# Patient Record
Sex: Female | Born: 1965
Health system: Southern US, Community
[De-identification: ages and names within clinical notes are randomized; demographics above are authoritative.]

## PROBLEM LIST (undated history)

## (undated) DIAGNOSIS — Z860100 Personal history of colon polyps, unspecified: Secondary | ICD-10-CM

## (undated) DIAGNOSIS — T7840XA Allergy, unspecified, initial encounter: Secondary | ICD-10-CM

## (undated) DIAGNOSIS — Z9109 Other allergy status, other than to drugs and biological substances: Secondary | ICD-10-CM

## (undated) DIAGNOSIS — L509 Urticaria, unspecified: Secondary | ICD-10-CM

## (undated) DIAGNOSIS — K219 Gastro-esophageal reflux disease without esophagitis: Secondary | ICD-10-CM

## (undated) DIAGNOSIS — R931 Abnormal findings on diagnostic imaging of heart and coronary circulation: Secondary | ICD-10-CM

## (undated) DIAGNOSIS — Z87898 Personal history of other specified conditions: Secondary | ICD-10-CM

## (undated) DIAGNOSIS — R51 Headache: Secondary | ICD-10-CM

## (undated) DIAGNOSIS — F419 Anxiety disorder, unspecified: Secondary | ICD-10-CM

## (undated) DIAGNOSIS — K648 Other hemorrhoids: Secondary | ICD-10-CM

## (undated) DIAGNOSIS — N62 Hypertrophy of breast: Secondary | ICD-10-CM

## (undated) DIAGNOSIS — E785 Hyperlipidemia, unspecified: Secondary | ICD-10-CM

## (undated) DIAGNOSIS — Z85828 Personal history of other malignant neoplasm of skin: Secondary | ICD-10-CM

## (undated) HISTORY — DX: Personal history of other malignant neoplasm of skin: Z85.828

## (undated) HISTORY — DX: Anxiety disorder, unspecified: F41.9

## (undated) HISTORY — DX: Other hemorrhoids: K64.8

## (undated) HISTORY — PX: POLYPECTOMY: SHX149

## (undated) HISTORY — PX: DESTRUCTION OF LESION ANAL: SUR415

## (undated) HISTORY — DX: Personal history of other specified conditions: Z87.898

## (undated) HISTORY — DX: Urticaria, unspecified: L50.9

## (undated) HISTORY — DX: Other allergy status, other than to drugs and biological substances: Z91.09

## (undated) HISTORY — DX: Abnormal findings on diagnostic imaging of heart and coronary circulation: R93.1

## (undated) HISTORY — DX: Hypertrophy of breast: N62

## (undated) HISTORY — DX: Personal history of colon polyps, unspecified: Z86.0100

## (undated) HISTORY — PX: MOHS SURGERY: SUR867

## (undated) HISTORY — DX: Gastro-esophageal reflux disease without esophagitis: K21.9

## (undated) HISTORY — DX: Headache: R51

## (undated) HISTORY — DX: Allergy, unspecified, initial encounter: T78.40XA

## (undated) HISTORY — DX: Hyperlipidemia, unspecified: E78.5

## (undated) HISTORY — PX: WISDOM TOOTH EXTRACTION: SHX21

## (undated) HISTORY — PX: COLONOSCOPY: SHX174

## (undated) HISTORY — DX: Personal history of colonic polyps: Z86.010

## (undated) HISTORY — PX: DILATION AND CURETTAGE OF UTERUS: SHX78

---

## 2006-11-27 LAB — HM COLONOSCOPY: HM Colonoscopy: NORMAL

## 2008-10-30 ENCOUNTER — Emergency Department (HOSPITAL_COMMUNITY): Admission: EM | Admit: 2008-10-30 | Discharge: 2008-10-30 | Payer: Self-pay | Admitting: Emergency Medicine

## 2009-02-22 LAB — CONVERTED CEMR LAB: Pap Smear: NORMAL

## 2009-02-22 LAB — HM MAMMOGRAPHY: HM Mammogram: NORMAL

## 2009-03-09 ENCOUNTER — Ambulatory Visit: Payer: Self-pay | Admitting: Internal Medicine

## 2009-03-09 DIAGNOSIS — Z85828 Personal history of other malignant neoplasm of skin: Secondary | ICD-10-CM | POA: Insufficient documentation

## 2009-03-09 DIAGNOSIS — F410 Panic disorder [episodic paroxysmal anxiety] without agoraphobia: Secondary | ICD-10-CM | POA: Insufficient documentation

## 2009-03-09 DIAGNOSIS — T50995A Adverse effect of other drugs, medicaments and biological substances, initial encounter: Secondary | ICD-10-CM | POA: Insufficient documentation

## 2009-03-09 DIAGNOSIS — R519 Headache, unspecified: Secondary | ICD-10-CM | POA: Insufficient documentation

## 2009-03-09 DIAGNOSIS — R32 Unspecified urinary incontinence: Secondary | ICD-10-CM | POA: Insufficient documentation

## 2009-03-09 DIAGNOSIS — R51 Headache: Secondary | ICD-10-CM | POA: Insufficient documentation

## 2009-03-09 DIAGNOSIS — E785 Hyperlipidemia, unspecified: Secondary | ICD-10-CM | POA: Insufficient documentation

## 2009-03-09 DIAGNOSIS — D126 Benign neoplasm of colon, unspecified: Secondary | ICD-10-CM | POA: Insufficient documentation

## 2009-03-09 DIAGNOSIS — A630 Anogenital (venereal) warts: Secondary | ICD-10-CM | POA: Insufficient documentation

## 2009-03-09 DIAGNOSIS — Z86010 Personal history of colonic polyps: Secondary | ICD-10-CM | POA: Insufficient documentation

## 2009-03-22 LAB — CONVERTED CEMR LAB
ALT: 13 units/L (ref 0–35)
AST: 19 units/L (ref 0–37)
Albumin: 3.8 g/dL (ref 3.5–5.2)
Alkaline Phosphatase: 55 units/L (ref 39–117)
BUN: 10 mg/dL (ref 6–23)
Basophils Absolute: 0 10*3/uL (ref 0.0–0.1)
Basophils Relative: 0 % (ref 0.0–3.0)
Bilirubin, Direct: 0.1 mg/dL (ref 0.0–0.3)
CO2: 29 meq/L (ref 19–32)
Calcium: 8.7 mg/dL (ref 8.4–10.5)
Chloride: 106 meq/L (ref 96–112)
Cholesterol: 207 mg/dL — ABNORMAL HIGH (ref 0–200)
Creatinine, Ser: 0.8 mg/dL (ref 0.4–1.2)
Direct LDL: 131.4 mg/dL
Eosinophils Absolute: 0.1 10*3/uL (ref 0.0–0.7)
Eosinophils Relative: 1.2 % (ref 0.0–5.0)
Ferritin: 20.8 ng/mL (ref 10.0–291.0)
Free T4: 0.7 ng/dL (ref 0.6–1.6)
GFR calc non Af Amer: 84.29 mL/min (ref >60)
Glucose, Bld: 63 mg/dL — ABNORMAL LOW (ref 70–99)
HCT: 34.4 % — ABNORMAL LOW (ref 36.0–46.0)
HDL: 56.3 mg/dL (ref >39.00)
Hemoglobin: 11.8 g/dL — ABNORMAL LOW (ref 12.0–15.0)
Lymphocytes Relative: 44.4 % (ref 12.0–46.0)
Lymphs Abs: 2.5 10*3/uL (ref 0.7–4.0)
MCHC: 34.2 g/dL (ref 30.0–36.0)
MCV: 90.6 fL (ref 78.0–100.0)
Monocytes Absolute: 0.3 10*3/uL (ref 0.1–1.0)
Monocytes Relative: 4.8 % (ref 3.0–12.0)
Neutro Abs: 2.8 10*3/uL (ref 1.4–7.7)
Neutrophils Relative %: 49.6 % (ref 43.0–77.0)
Platelets: 315 10*3/uL (ref 150.0–400.0)
Potassium: 3.8 meq/L (ref 3.5–5.1)
RBC: 3.79 M/uL — ABNORMAL LOW (ref 3.87–5.11)
RDW: 13 % (ref 11.5–14.6)
Sodium: 141 meq/L (ref 135–145)
TSH: 0.87 microintl units/mL (ref 0.35–5.50)
Total Bilirubin: 0.7 mg/dL (ref 0.3–1.2)
Total CHOL/HDL Ratio: 4
Total Protein: 6.4 g/dL (ref 6.0–8.3)
Triglycerides: 54 mg/dL (ref 0.0–149.0)
VLDL: 10.8 mg/dL (ref 0.0–40.0)
WBC: 5.7 10*3/uL (ref 4.5–10.5)

## 2009-04-06 ENCOUNTER — Ambulatory Visit: Payer: Self-pay | Admitting: Family Medicine

## 2009-04-06 DIAGNOSIS — A088 Other specified intestinal infections: Secondary | ICD-10-CM | POA: Insufficient documentation

## 2009-04-09 ENCOUNTER — Ambulatory Visit: Payer: Self-pay | Admitting: Internal Medicine

## 2009-04-09 DIAGNOSIS — R5383 Other fatigue: Secondary | ICD-10-CM

## 2009-04-09 DIAGNOSIS — R5381 Other malaise: Secondary | ICD-10-CM | POA: Insufficient documentation

## 2009-09-02 ENCOUNTER — Ambulatory Visit: Payer: Self-pay | Admitting: Internal Medicine

## 2010-04-11 ENCOUNTER — Ambulatory Visit: Payer: Self-pay | Admitting: Internal Medicine

## 2010-04-11 DIAGNOSIS — J019 Acute sinusitis, unspecified: Secondary | ICD-10-CM | POA: Insufficient documentation

## 2010-08-05 ENCOUNTER — Ambulatory Visit: Payer: Self-pay | Admitting: Family Medicine

## 2010-08-05 DIAGNOSIS — R599 Enlarged lymph nodes, unspecified: Secondary | ICD-10-CM | POA: Insufficient documentation

## 2010-08-05 DIAGNOSIS — R0982 Postnasal drip: Secondary | ICD-10-CM | POA: Insufficient documentation

## 2010-09-01 ENCOUNTER — Ambulatory Visit: Payer: Self-pay | Admitting: Internal Medicine

## 2010-12-27 NOTE — Assessment & Plan Note (Signed)
Summary: diarrhea   Vital Signs:  Patient profile:   45 year old female Menstrual status:  regular Weight:      142 pounds Temp:     98.2 degrees F oral BP sitting:   80 / 60  (left arm) Cuff size:   regular  Vitals Entered By: Sid Falcon LPN (Apr 06, 2009 10:01 AM)  Serial Vital Signs/Assessments:  Time      Position  BP       Pulse  Resp  Temp     By                     84/60                          Evelena Peat MD  CC: Past week pt experienced stomach pains, cramping, diarrhea. Is Patient Diabetic? No   History of Present Illness: Patient is a work in with onset of illness last week. She had onset Thursday of some fairly severe GERD symptoms after eating steak. By the next day she developed some nonbloody diarrhea but no nausea, vomiting, or fever. Her husband did not have similar symptoms. She had fairly severe diarrhea Friday and Saturday with Saturday having watery nonbloody stools approximately every half hour. Still no fever. By Sunday she tried drinking some chicken broth but had some diffuse fairly severe abdominal cramps. Yesterday, she had solid bowel movements the by this morning had 3 episodes of loose stools. She did eat some peanut butter last night which he thinks may have been a factor in that. She had low-grade fever of 100.1 Saturday but none since then. No recent travels. No recent antibiotics. No change of medication. She has not tried any Imodium.  no orthostatic symptoms.  Allergies: 1)  ! Codeine  Past History:  Past Medical History:    Headache  Migraines      Hyperlipidemia    Urinary incontinence related to  childbirth  urge and stress  [name] helps     Skin cancer, hx of basal cell ca     Colonic polyps, hx of  found during eval for rectal bleeding.  ?     Hx of syncope with low bp  orthostatic  onset Hs      G4 P2      (03/09/2009)  Review of Systems      See HPI  Physical Exam  General:  patient is alert and healthy in appearance  Mouth:  Oral mucosa and oropharynx without lesions or exudates.  Teeth in good repair. Heart:  Normal rate and regular rhythm. S1 and S2 normal without gallop, murmur, click, rub or other extra sounds. Abdomen:  nondistended. Normal bowel sounds. Soft and nontender. No organomegaly.   Impression & Recommendations:  Problem # 1:  GASTROENTERITIS, VIRAL (ICD-008.8) She appears well-hydrated. Suspect this is resolving. She'll try over-the-counter Imodium and also continue electrolyte replacement with things like Gatorade.  Touch base if diarrhea not resolved in one week.  Complete Medication List: 1)  Toviaz 4 Mg Xr24h-tab (Fesoterodine fumarate) .Marland Kitchen.. 1 by mouth once daily 2)  Lexapro 10 Mg Tabs (Escitalopram oxalate) .Marland Kitchen.. 1 by mouth once daily 3)  Alprazolam 0.25 Mg Tabs (Alprazolam) .... As needed

## 2010-12-27 NOTE — Assessment & Plan Note (Signed)
Summary: ?sinus inf/chest congestion/cjr   Vital Signs:  Patient profile:   45 year old female Menstrual status:  regular Height:      66.25 inches Weight:      145.8 pounds BMI:     23.44 Temp:     98.1 degrees F oral Pulse rate:   91 / minute BP sitting:   90 / 60  (right arm)  Vitals Entered By: Kathrynn Speed CMA (Apr 11, 2010 2:27 PM) CC: Sinus/ headache/ tightness in chest & coughin 1 week   History of Present Illness: Tamara Watkins comes  in for 3 weeks of signs  onset with cough and uri.   Onset like a head   cold  and never  went away. hearin ok   HA in frontal area and face and teeth  hurts.  Some swollen glands and then coughing and drainage .  tried  aline irrigations No fever . No  hx of recurrent sinus problem or allergy. NO sig ets.   Airflight weekly . LAst antibiotic  doxy .         Preventive Screening-Counseling & Management  Alcohol-Tobacco     Alcohol drinks/day: 2     Alcohol type: wine     Smoking Status: never  Caffeine-Diet-Exercise     Caffeine use/day: 1     Does Patient Exercise: yes     Type of exercise: jogging or swim     Exercise (avg: min/session): 30-60     Times/week: 3  Current Medications (verified): 1)  Toviaz 4 Mg Xr24h-Tab (Fesoterodine Fumarate) .Marland Kitchen.. 1 By Mouth Once Daily 2)  Lexapro 10 Mg Tabs (Escitalopram Oxalate) .Marland Kitchen.. 1 By Mouth Once Daily 3)  Alprazolam 0.25 Mg Tabs (Alprazolam) .... As Needed  Allergies (verified): 1)  ! Codeine  Past History:  Past medical, surgical, family and social histories (including risk factors) reviewed for relevance to current acute and chronic problems.  Past Medical History: Reviewed history from 03/09/2009 and no changes required. Headache  Migraines   Hyperlipidemia Urinary incontinence related to  childbirth  urge and stress  [name] helps  Skin cancer, hx of basal cell ca  Colonic polyps, hx of  found during eval for rectal bleeding.  ?  Hx of syncope with low bp  orthostatic   onset Hs   G4 P2   Past Surgical History: Reviewed history from 03/09/2009 and no changes required. Caesarean section D and E for miscarrage P4 G2  Family History: Reviewed history from 03/09/2009 and no changes required. Father: Colon Polyp, depression, HBP, tranverse mylitis Mother High Cholesterol Siblings:  Brother-Depression, HBP, Sister-Depression and anxiety  children allergies .   Social History: Reviewed history from 03/09/2009 and no changes required. Occupation: Estate agent.. Masters  level education. Married  Never Smoked Alcohol use-yes Drug use-no Regular exercise-yes hhof 4   no pets    recently moved from [place]  Caffeine use/day:  1  Review of Systems  The patient denies anorexia, fever, chest pain, and abdominal pain.         faitgue and achey .  no sig GU or GI illness .    NO fever   Physical Exam  General:  Well-developed,well-nourished,in no acute distress; alert,appropriate and cooperative throughout examination Head:  normocephalic and atraumatic.   Eyes:  vision grossly intact, pupils equal, and pupils round.   Ears:  R ear normal and L ear normal.   Nose:  congested and tender maxillay frontal area  Mouth:  drainage tracts no  edema no lesions Neck:  shoddy  nodes  Lungs:  Normal respiratory effort, chest expands symmetrically. Lungs are clear to auscultation, no crackles or wheezes. Heart:  normal rate and regular rhythm.   Pulses:  nl cap  refill  Neurologic:  non focal  grossly Skin:  turgor normal, color normal, no ecchymoses, and no petechiae.   Cervical Nodes:  shoddy   Impression & Recommendations:  Problem # 1:  SINUSITIS - ACUTE-NOS (ICD-461.9)  prolonged uri 3 weeks with sinus signs   disc use of decon pre airflights .    Her updated medication list for this problem includes:    Amoxicillin-pot Clavulanate 875-125 Mg Tabs (Amoxicillin-pot clavulanate) .Marland Kitchen... 1 by mouth two times a day for sinusitis  Instructed on  treatment. Call if symptoms persist or worsen.   Complete Medication List: 1)  Toviaz 4 Mg Xr24h-tab (Fesoterodine fumarate) .Marland Kitchen.. 1 by mouth once daily 2)  Lexapro 10 Mg Tabs (Escitalopram oxalate) .Marland Kitchen.. 1 by mouth once daily 3)  Alprazolam 0.25 Mg Tabs (Alprazolam) .... As needed 4)  Amoxicillin-pot Clavulanate 875-125 Mg Tabs (Amoxicillin-pot clavulanate) .Marland Kitchen.. 1 by mouth two times a day for sinusitis  Patient Instructions: 1)  take antibiotic   saline washes and decongestant before flight. 2)  expect improvement in 3-5 days  3)  call if  needed .  Prescriptions: AMOXICILLIN-POT CLAVULANATE 875-125 MG TABS (AMOXICILLIN-POT CLAVULANATE) 1 by mouth two times a day for sinusitis  #20 x 0   Entered and Authorized by:   Madelin Headings MD   Signed by:   Madelin Headings MD on 04/11/2010   Method used:   Electronically to        [facility]. [phone]* (retail)       [phone]       [place], [place]  [address]       Ph: [phone]       Fax: [phone]   RxID:   [id number]

## 2010-12-27 NOTE — Assessment & Plan Note (Signed)
Summary: flu shot/cjr  Nurse Visit   Allergies: 1)  ! Codeine  Orders Added: 1)  Admin 1st Vaccine [90471] 2)  Flu Vaccine 51yrs + [34742] Flu Vaccine Consent Questions     Do you have a history of severe allergic reactions to this vaccine? no    Any prior history of allergic reactions to egg and/or gelatin? no    Do you have a sensitivity to the preservative Thimersol? no    Do you have a past history of Guillan-Barre Syndrome? no    Do you currently have an acute febrile illness? no    Have you ever had a severe reaction to latex? no    Vaccine information given and explained to patient? yes    Are you currently pregnant? no    Lot Number:[id number]   Exp Date:05/26/2010   Site Given  Left Deltoid IMes-CCC]  .lbflu

## 2010-12-27 NOTE — Assessment & Plan Note (Signed)
Summary: flu shot/njr  Nurse Visit   Allergies: 1)  ! Codeine  Orders Added: 1)  Admin 1st Vaccine [90471] 2)  Flu Vaccine 37yrs + [16109]  Flu Vaccine Consent Questions     Do you have a history of severe allergic reactions to this vaccine? no    Any prior history of allergic reactions to egg and/or gelatin? no    Do you have a sensitivity to the preservative Thimersol? no    Do you have a past history of Guillan-Barre Syndrome? no    Do you currently have an acute febrile illness? no    Have you ever had a severe reaction to latex? no    Vaccine information given and explained to patient? yes    Are you currently pregnant? no    Lot Number:[id number]   Exp Date:05/27/2011   Site Given  Left Deltoid IM Romualdo Bolk, CMA (AAMA)  September 01, 2010 10:14 AM

## 2010-12-27 NOTE — Assessment & Plan Note (Signed)
Summary: Ear pain/sore throat/cb resched. per debbie/cb   Vital Signs:  Patient profile:   45 year old female Menstrual status:  regular LMP:     07/04/2010 Weight:      152 pounds Temp:     98.9 degrees F oral Pulse rate:   88 / minute BP sitting:   100 / 60  (right arm) Cuff size:   regular  Vitals Entered By: Romualdo Bolk, CMA (AAMA) (August 05, 2010 4:32 PM) CC: Bilateral ear pain that is worse in rt ear than left. Scratchy voice on and off x 7 weeks worse in afternoons. Sinus ha's for 6 weeks. LMP (date): 07/04/2010 LMP - Character: normal Menarche (age onset years): 14   Menses interval (days): 28-30 Menstrual flow (days): 4 Enter LMP: 07/04/2010 Last PAP Result normal   History of Present Illness: Tamara Watkins comes in today  for  sda for above problem.   harse and thooat drainage for weeks   Has tried  sinus  med with help for ha not rest.  chronic drainage    and recently ear pain. with HA s daily unsure cause.       travels in air    2 x per week and   out of town Mon to [place]. Symptoms are worse in eve and late pm.   no fever or sweats.  the right  gland neck tender.  to touch not to swallow .  No hx of mono . LAst uri sinusitis got better on antiboitic in May.  Preventive Screening-Counseling & Management  Alcohol-Tobacco     Alcohol drinks/day: 2     Alcohol type: wine     Smoking Status: never  Caffeine-Diet-Exercise     Caffeine use/day: 1     Does Patient Exercise: yes     Type of exercise: jogging or swim     Exercise (avg: min/session): 30-60     Times/week: 3  Current Medications (verified): 1)  Toviaz 8 Mg Xr24h-Tab (Fesoterodine Fumarate) .Marland Kitchen.. 1 By Mouth Once Daily 2)  Lexapro 10 Mg Tabs (Escitalopram Oxalate) .Marland Kitchen.. 1 By Mouth Once Daily 3)  Alprazolam 0.25 Mg Tabs (Alprazolam) .... As Needed 4)  Amoxicillin-Pot Clavulanate 875-125 Mg Tabs (Amoxicillin-Pot Clavulanate) .Marland Kitchen.. 1 By Mouth Two Times A Day For Sinusitis  Allergies  (verified): 1)  ! Codeine  Past History:  Past medical, surgical, family and social histories (including risk factors) reviewed, and no changes noted (except as noted below).  Past Medical History: Reviewed history from 03/09/2009 and no changes required. Headache  Migraines   Hyperlipidemia Urinary incontinence related to  childbirth  urge and stress  [name] helps  Skin cancer, hx of basal cell ca  Colonic polyps, hx of  found during eval for rectal bleeding.  ?  Hx of syncope with low bp  orthostatic  onset Hs   G4 P2   Past Surgical History: Reviewed history from 03/09/2009 and no changes required. Caesarean section D and E for miscarrage P4 G2  Past History:  Care Management: Gynecology: Dr. Malva Limes Gastroenterology: In [facility]  Dermatology: Dr. Deloria Lair in [place]- Pt to find a md in [place]  Family History: Reviewed history from 03/09/2009 and no changes required. Father: Colon Polyp, depression, HBP, tranverse mylitis Mother High Cholesterol Siblings:  Brother-Depression, HBP, Sister-Depression and anxiety  children allergies .   Social History: Reviewed history from 03/09/2009 and no changes required. 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         lexapro helps with anxiety and mood  rarely takes xanax   Physical Exam  General:  mildly hoarse in nad looks well well-developed, well-nourished, and appropriate dress.   Head:  normocephalic and atraumatic.   Eyes:    PERRL, EOMs full, conjunctiva clear  Ears:  R ear normal, L ear normal, and no external deformities.   Nose:   no external deformity.  congestion  no facial tenderness says hurts behind eyes  Mouth:  1+ red no exudate or edema  .   no lesions Neck:  midly tender right ac node  1.5 cm ovoid    Lungs:  Normal respiratory effort, chest expands symmetrically. Lungs are clear to auscultation, no crackles or wheezes. Heart:  normal rate, regular rhythm, and no murmur.   Abdomen:  Bowel sounds positive,abdomen soft and non-tender without masses, organomegaly or hernias noted. Msk:  no joint swelling and no joint warmth.   Pulses:  nl cap refill  Extremities:  nl cap refill  Neurologic:  non focal  Skin:  turgor normal, color normal, no ecchymoses, and no petechiae.   Cervical Nodes:  no posterior cervical adenopathy.   right ac see  neck exam  Axillary Nodes:  No palpable lymphadenopathy Inguinal Nodes:  No significant adenopathy Psych:  Oriented X3, good eye contact, not anxious appearing, and not depressed appearing.     Impression & Recommendations:  Problem # 1:  CERVICAL LYMPHADENOPATHY, RIGHT (ICD-785.6) clnical picture  of poss chronic sinsutis with right     reactive.    if not getting better  consider checking for mono etc  suspect ear pain referred from throat neck?  The following medications were removed from the medication list:    Amoxicillin-pot Clavulanate 875-125 Mg Tabs (Amoxicillin-pot clavulanate) .Marland Kitchen... 1 by mouth two times a day for sinusitis Her updated medication list for this problem includes:    Amoxicillin-pot Clavulanate 875-125 Mg Tabs (Amoxicillin-pot clavulanate) .Marland Kitchen... 1 by mouth two times a day  Problem # 2:  POSTNASAL DRIP SYNDROME (ICD-784.91) ? allergic    Problem # 3:  SINUSITIS - ACUTE-NOS (ICD-461.9) vs chronic         The following medications were removed from the medication list:    Amoxicillin-pot Clavulanate 875-125 Mg Tabs (Amoxicillin-pot clavulanate) .Marland Kitchen... 1 by mouth two times a day for sinusitis Her updated medication list for this problem includes:     Fluticasone Propionate 50 Mcg/act Susp (Fluticasone propionate) .Marland Kitchen... 2 spray s each nares q d   fo chronic drainage    Amoxicillin-pot Clavulanate 875-125 Mg Tabs (Amoxicillin-pot clavulanate) .Marland Kitchen... 1 by mouth two times a day  Complete Medication List: 1)  Toviaz 8 Mg Xr24h-tab (Fesoterodine fumarate) .Marland Kitchen.. 1 by mouth once daily 2)  Lexapro 10 Mg Tabs (Escitalopram oxalate) .Marland Kitchen.. 1 by mouth once daily 3)  Alprazolam 0.25 Mg Tabs (Alprazolam) .... As needed 4)  Fluticasone Propionate 50 Mcg/act Susp (Fluticasone propionate) .... 2 spray s each nares q d   fo chronic drainage 5)  Amoxicillin-pot Clavulanate 875-125 Mg Tabs (Amoxicillin-pot clavulanate) .Marland Kitchen.. 1 by mouth two times a day  Patient Instructions: 1)  try antihistamine  for  the drainage and nasal cortisone . 2)  can add  antibioitc for   smoldering sinusitis . 3)  return office visit in 3-4 weeks .  ( can cancel if all better  and give [place] status report)  Prescriptions: AMOXICILLIN-POT CLAVULANATE 875-125 MG TABS (AMOXICILLIN-POT CLAVULANATE) 1 by mouth two times a day  #20 x 0   Entered and Authorized by:   Madelin Headings MD   Signed by:   Madelin Headings MD on 08/05/2010   Method used:   Electronically to        [facility] # [address] (retail)       [address]       [place], [place]  [address]       Ph: [phone]       Fax: [phone]   RxID:   [id number] FLUTICASONE PROPIONATE 50 MCG/ACT SUSP (FLUTICASONE PROPIONATE) 2 spray s each nares q d   fo chronic drainage  #1 x 3   Entered and Authorized by:   Madelin Headings MD   Signed by:   Madelin Headings MD on 08/05/2010   Method used:   Electronically to        [facility] # [address]* (retail)       [address]       [place], [place]  [address]       Ph: [phone]       Fax: [phone]   RxID:   [id number]

## 2010-12-27 NOTE — Assessment & Plan Note (Signed)
Summary: dehydrated/dm   Vital Signs:  Patient profile:   45 year old female Menstrual status:  regular Weight:      140 pounds Temp:     97.9 degrees F oral Pulse rate:   72 / minute BP sitting:   100 / 72  (left arm) Cuff size:   regular  Vitals Entered By: Romualdo Bolk, CMA (Apr 09, 2009 4:16 PM) CC: Pt is still having diarrhea but it is resolving. Pt is also having extreme fatigue since last night. Pt hasn't ate as much as normal for 8 days.   History of Present Illness: Tamara Watkins comes comes in for above  . called at end of day before weekend  as a SDA because has increasing fatigue .   Still very tired. .     Diarrhea  doing better  had to travel to [place] for a meeting . Ate bagel and pretzels.    some this am.  Less frequent but present.     No family illness.  Otherwise has only been taking clear liquids . NO NV  and sometimes is hungry but has diarrhea if eats .  Sever abd crampig is better than at onset.   Lexapro no missed   . pill and  ok with med.  Not a se   and toviaz  some.  Immodium not that helpful.   HAving living off clear liquids and clear liquids .     See previous notes .   No bloody diarrhea except hemorrhoid issues.  Recently had brpink when wipes .   Preventive Screening-Counseling & Management     Alcohol drinks/day: 2     Alcohol type: wine     Smoking Status: never     Caffeine use/day: 0     Does Patient Exercise: yes     Type of exercise: jogging or swim     Exercise (avg: min/session): 30-60     Times/week: 3  Current Medications (verified): 1)  Toviaz 4 Mg Xr24h-Tab (Fesoterodine Fumarate) .Marland Kitchen.. 1 By Mouth Once Daily 2)  Lexapro 10 Mg Tabs (Escitalopram Oxalate) .Marland Kitchen.. 1 By Mouth Once Daily 3)  Alprazolam 0.25 Mg Tabs (Alprazolam) .... As Needed  Allergies (verified): 1)  ! Codeine  Past History:  Past medical, surgical, family and social histories (including risk factors) reviewed, and no changes noted (except as noted below).   Past Medical History:    Reviewed history from 03/09/2009 and no changes required:    Headache  Migraines      Hyperlipidemia    Urinary incontinence related to  childbirth  urge and stress  [name] helps     Skin cancer, hx of basal cell ca     Colonic polyps, hx of  found during eval for rectal bleeding.  ?     Hx of syncope with low bp  orthostatic  onset Hs      G4 P2   Past Surgical History:    Reviewed history from 03/09/2009 and no changes required:    Caesarean section    D and E for miscarrage    P4 G2  Family History:    Reviewed history from 03/09/2009 and no changes required:       Father: Colon Polyp, depression, HBP, tranverse mylitis       Mother High Cholesterol       Siblings:  Brother-Depression, HBP, Sister-Depression and anxiety              children  allergies .   Social History:    Reviewed history from 03/09/2009 and no changes required:       Occupation: [employer].. Masters  level education.       Married        Never Smoked       Alcohol use-yes       Drug use-no       Regular exercise-yes       hhof 4   no pets                recently moved from [place]   Review of Systems  The patient denies fever, weight gain, hoarseness, chest pain, syncope, dyspnea on exertion, peripheral edema, prolonged cough, headaches, hemoptysis, abdominal pain, melena, hematochezia, severe indigestion/heartburn, hematuria, incontinence, difficulty walking, abnormal bleeding, and enlarged lymph nodes.    Physical Exam  General:  Well-developed,well-nourished,in no acute distress; alert,appropriate and cooperative throughout examinationlooks tired but not acutely ill  Head:  normocephalic and atraumatic.   Eyes:  vision grossly intact, pupils equal, and pupils round.   Ears:  R ear normal and L ear normal.   Nose:  no external deformity and no external erythema.   Mouth:  pharynx pink and moist.   Neck:  No deformities, masses, or tenderness noted. Lungs:  Normal  respiratory effort, chest expands symmetrically. Lungs are clear to auscultation, no crackles or wheezes.no dullness.   Heart:  Normal rate and regular rhythm. S1 and S2 normal without gallop, murmur, click, rub or other extra sounds.no lifts.   Abdomen:  soft, non-tender, no masses, no guarding, no rigidity, no rebound tenderness, no hepatomegaly, and no splenomegaly.   Pulses:  pulses intact without delay  nl cap refill Extremities:  no clubbing cyanosis or edema  Neurologic:  non focal Skin:  turgor normal and color normal.  no jaundice  Cervical Nodes:  No lymphadenopathy noted Psych:  Normal eye contact, appropriate affect. Cognition appears normal.  Additional Exam:  reviwed Previous evaluation   Impression & Recommendations:  Problem # 1:  FATIGUE (ICD-780.79) Assessment New prob from no sig nutrition    and not dehydration at present.  rest and increase food and consider lab and stool cx on monday if not improved .  Problem # 2:  GASTROENTERITIS, VIRAL (ICD-008.8) ? improved diarrhea but exhausted  . disc of diff dx and consideration of getting stool test or empiric rx if continuing .   Problem # 3:  PANIC DISORDER (ICD-300.01) Assessment: Unchanged  Her updated medication list for this problem includes:    Lexapro 10 Mg Tabs (Escitalopram oxalate) .Marland Kitchen... 1 by mouth once daily    Alprazolam 0.25 Mg Tabs (Alprazolam) .Marland Kitchen... As needed  Complete Medication List: 1)  Toviaz 4 Mg Xr24h-tab (Fesoterodine fumarate) .Marland Kitchen.. 1 by mouth once daily 2)  Lexapro 10 Mg Tabs (Escitalopram oxalate) .Marland Kitchen.. 1 by mouth once daily 3)  Alprazolam 0.25 Mg Tabs (Alprazolam) .... As needed 50% of visit spent in counseling    25 minutes  Patient Instructions: 1)  try increaseing     protein and food    realizing increase stool but poss decrease fatigue  2)  avoid juices caffeine,  alcohol,   spicy and fried .High fiber.  3)  Yogurt  , pieces of chicken non fried .  ,   4)  call on monday am  about  progress   and plan  labscultures etc then  if needed

## 2010-12-27 NOTE — Assessment & Plan Note (Signed)
Summary: to be est/njr   Vital Signs:  Patient profile:   45 year old female Menstrual status:  regular LMP:     03/08/2009 Height:      66.25 inches Weight:      144 pounds BMI:     23.15 Pulse rate:   72 / minute BP sitting:   100 / 60  (left arm) Cuff size:   regular  Vitals Entered By: Romualdo Bolk, CMA (March 09, 2009 10:54 AM) CC: New Patient to get establish. Pt wants to discuss restless leg.  LMP (date): 03/08/2009 LMP - Character: normal Menarche (age onset): 14 years  Menses interval: 28-30 days  Menstrual flow (days) 4 Menstrual Status regular Enter LMP: 03/08/2009 Last PAP Result normal   History of Present Illness: Tamara Watkins comes in today fas a new patient to establish. See data base . Migraines   one sided and expanding and  about once a month.  Takes  2 advil and helps sometimes.    No aura.  Lipids not on meds but  has had 210 to 145 but food ratio .   had good particles size .    Panic disorder   since age 28 or so.     rarely uses x anax.    Has been on paxil in the past and hard to get off.    had restlessness  in the past.     ? if  related to going on medication   of a month with internal restless ness. ? if ocurring some on urinary incontinence.    Had a virus that gave her   tachycardia and   vertigo.    seen in [place] and had some labs then . Fall 2009   but was  not on lexapro  then.   Back on lexapro for t3 months and signs resolved . Restlessness was better  ie   none until UI medication started.      Preventive Screening-Counseling & Management     Alcohol drinks/day: 2     Alcohol type: wine     Smoking Status: never     Caffeine use/day: 0     Does Patient Exercise: yes     Type of exercise: jogging or swim     Exercise (avg: min/session): 30-60     Times/week: 3      Drug Use:  no.    Current Medications (verified): 1)  Toviaz 4 Mg Xr24h-Tab (Fesoterodine Fumarate) .Marland Kitchen.. 1 By Mouth Once Daily 2)  Lexapro 10 Mg Tabs  (Escitalopram Oxalate) .Marland Kitchen.. 1 By Mouth Once Daily 3)  Alprazolam 0.25 Mg Tabs (Alprazolam) .... As Needed  Allergies (verified): 1)  ! Codeine  Past History:  Past Medical History:    Headache  Migraines      Hyperlipidemia    Urinary incontinence related to  childbirth  urge and stress  [name] helps     Skin cancer, hx of basal cell ca     Colonic polyps, hx of  found during eval for rectal bleeding.  ?     Hx of syncope with low bp  orthostatic  onset Hs      G4 P2   Past Surgical History:    Caesarean section    D and E for miscarrage    P4 G2  Past History:  Care Management:    Gynecology: Dr. Malva Limes    Gastroenterology: In [facility]     Dermatology: Dr. Deloria Lair in  [place]- Pt to find a md in [place]  Family History:    Father: Colon Polyp, depression, HBP, tranverse mylitis    Mother High Cholesterol    Siblings:  Brother-Depression, HBP, Sister-Depression and anxiety        children allergies .   Social History:    Occupation: Estate agent.. Masters  level education.    Married     Never Smoked    Alcohol use-yes    Drug use-no    Regular exercise-yes    hhof 4   no pets          recently moved from [place]     Smoking Status:  never    Caffeine use/day:  0    Does Patient Exercise:  yes    Occupation:  employed    Drug Use:  no  Review of Systems  The patient denies anorexia, fever, weight loss, weight gain, chest pain, syncope, prolonged cough, hemoptysis, abdominal pain, melena, hematochezia, severe indigestion/heartburn, muscle weakness, depression, unusual weight change, abnormal bleeding, enlarged lymph nodes, and angioedema.         donates blood  rest of rox neg or [place]  Physical Exam  General:  Well-developed,well-nourished,in no acute distress; alert,appropriate and cooperative throughout examination Head:  normocephalic and atraumatic.   Neck:  No deformities, masses, or tenderness noted. Lungs:  Normal respiratory effort, chest  expands symmetrically. Lungs are clear to auscultation, no crackles or wheezes. Heart:  Normal rate and regular rhythm. S1 and S2 normal without gallop, murmur, click, rub or other extra sounds.no lifts.   Abdomen:  Bowel sounds positive,abdomen soft and non-tender without masses, organomegaly or hernias noted. Msk:  no acute changes  Pulses:  pulses intact without delay   Extremities:  no clubbing cyanosis or edema  Neurologic:  alert & oriented X3, strength normal in all extremities, gait normal, DTRs symmetrical and normal, finger-to-nose normal, and Romberg negative.   Skin:  turgor normal, color normal, no ecchymoses, and no petechiae.   Cervical Nodes:  No lymphadenopathy noted Psych:  Oriented X3, good eye contact, not anxious appearing, and not depressed appearing.     Impression & Recommendations:  Problem # 1:  HEADACHE (ICD-784.0) prob migraine    Problem # 2:  PANIC DISORDER (ICD-300.01)  Her updated medication list for this problem includes:    Lexapro 10 Mg Tabs (Escitalopram oxalate) .Marland Kitchen... 1 by mouth once daily    Alprazolam 0.25 Mg Tabs (Alprazolam) .Marland Kitchen... As needed  Problem # 3:  ADVERSE REACTION TO MEDICATION (ZOX-096.04) ? if ui med Orders: TLB-TSH (Thyroid Stimulating Hormone) (84443-TSH) TLB-Hepatic/Liver Function Pnl (80076-HEPATIC) TLB-CBC Platelet - w/Differential (85025-CBCD) TLB-BMP (Basic Metabolic Panel-BMET) (80048-METABOL) TLB-Lipid Panel (80061-LIPID) TLB-Ferritin (82728-FER) TLB-T4 (Thyrox), Free [phone])  Problem # 4:  URINARY INCONTINENCE (ICD-788.30) Assessment: Comment Only  Problem # 5:  HYPERLIPIDEMIA (ICD-272.4)  Orders: TLB-TSH (Thyroid Stimulating Hormone) (84443-TSH) TLB-Hepatic/Liver Function Pnl (80076-HEPATIC) TLB-CBC Platelet - w/Differential (85025-CBCD) TLB-BMP (Basic Metabolic Panel-BMET) (80048-METABOL) TLB-Lipid Panel (80061-LIPID) TLB-Ferritin (82728-FER) TLB-T4 (Thyrox), Free (47829-FA2Z)  Complete Medication  List: 1)  Toviaz 4 Mg Xr24h-tab (Fesoterodine fumarate) .Marland Kitchen.. 1 by mouth once daily 2)  Lexapro 10 Mg Tabs (Escitalopram oxalate) .Marland Kitchen.. 1 by mouth once daily 3)  Alprazolam 0.25 Mg Tabs (Alprazolam) .... As needed  Patient Instructions: 1)  You will be informed of lab results when available.  2)  then plan follow up  rov 1 month  3)  HA calendar         Preventive Care Screening  Mammogram:    Date:  02/22/2009    Results:  normal   Pap Smear:    Date:  02/22/2009    Results:  normal   Colonoscopy:    Date:  11/27/2006    Results:  normal   Last Tetanus Booster:    Date:  11/27/2000    Results:  Historical

## 2011-02-08 ENCOUNTER — Ambulatory Visit (INDEPENDENT_AMBULATORY_CARE_PROVIDER_SITE_OTHER): Payer: Managed Care, Other (non HMO) | Admitting: Family Medicine

## 2011-02-08 ENCOUNTER — Encounter: Payer: Self-pay | Admitting: Family Medicine

## 2011-02-08 VITALS — BP 90/60 | Temp 98.3°F | Ht 65.75 in | Wt 150.0 lb

## 2011-02-08 DIAGNOSIS — F419 Anxiety disorder, unspecified: Secondary | ICD-10-CM

## 2011-02-08 DIAGNOSIS — J329 Chronic sinusitis, unspecified: Secondary | ICD-10-CM

## 2011-02-08 MED ORDER — LEVOFLOXACIN 500 MG PO TABS: 500.0000 mg | ORAL_TABLET | Freq: Every day | ORAL | Status: DC

## 2011-02-08 MED ORDER — ALPRAZOLAM 0.25 MG PO TABS: 0.2500 mg | ORAL_TABLET | Freq: Three times a day (TID) | ORAL | Status: DC | PRN

## 2011-02-08 NOTE — Progress Notes (Signed)
  Subjective:    Patient ID: Tamara Watkins, female    DOB: 09/25/1966, 45 y.o.   MRN: [id number]  HPI  patient seen with almost 2 month history of sinus pressure and pain. Intermittent headaches. Yellowish nasal discharge. Occasional cough. Intermittent left ear pain. No fever or chills.   Review of Systems  Constitutional: Positive for fatigue. Negative for fever, chills and appetite change.  HENT: Positive for congestion, postnasal drip and sinus pressure.   Respiratory: Positive for cough. Negative for shortness of breath and wheezing.   Cardiovascular: Negative for chest pain and leg swelling.  Neurological: Positive for headaches.       Objective:   Physical Exam  patient alert and in no distress. Afebrile. Nasal exam unremarkable Her drums normal Neck supple no adenopathy Chest clear to auscultation Heart regular rhythm and rate       Assessment & Plan:   acute versus chronic sinusitis. Levaquin 500 milligrams daily for 10 days

## 2011-02-08 NOTE — Patient Instructions (Signed)

## 2011-02-10 ENCOUNTER — Telehealth: Payer: Self-pay | Admitting: *Deleted

## 2011-02-10 MED ORDER — AMOXICILLIN-POT CLAVULANATE 875-125 MG PO TABS: 1.0000 | ORAL_TABLET | Freq: Two times a day (BID) | ORAL | Status: AC

## 2011-02-10 NOTE — Telephone Encounter (Signed)
Per Dr. Fabian Sharp- stop the medication. Can change to augmentin 875mg  bid x 10 days then rov if not better. Pt aware of this.

## 2011-02-10 NOTE — Telephone Encounter (Signed)
patient  Was given levaquin Monday and she stopped it today because she is having off and on shortness of breath and dizziness.  I suggested that she does not take any more.  Any further directions? [employer] [facility]

## 2011-04-07 ENCOUNTER — Ambulatory Visit (INDEPENDENT_AMBULATORY_CARE_PROVIDER_SITE_OTHER): Payer: Managed Care, Other (non HMO) | Admitting: Internal Medicine

## 2011-04-07 ENCOUNTER — Encounter: Payer: Self-pay | Admitting: Internal Medicine

## 2011-04-07 VITALS — BP 100/60 | HR 97 | Temp 98.2°F | Wt 150.0 lb

## 2011-04-07 DIAGNOSIS — R0982 Postnasal drip: Secondary | ICD-10-CM

## 2011-04-07 DIAGNOSIS — Z86010 Personal history of colonic polyps: Secondary | ICD-10-CM

## 2011-04-07 DIAGNOSIS — J029 Acute pharyngitis, unspecified: Secondary | ICD-10-CM | POA: Insufficient documentation

## 2011-04-07 DIAGNOSIS — R0989 Other specified symptoms and signs involving the circulatory and respiratory systems: Secondary | ICD-10-CM

## 2011-04-07 DIAGNOSIS — F410 Panic disorder [episodic paroxysmal anxiety] without agoraphobia: Secondary | ICD-10-CM

## 2011-04-07 DIAGNOSIS — R5381 Other malaise: Secondary | ICD-10-CM

## 2011-04-07 DIAGNOSIS — R5383 Other fatigue: Secondary | ICD-10-CM

## 2011-04-07 NOTE — Patient Instructions (Signed)
This may be a combination of allergy and withdrawal from your medication with some anxiety secondary It is possible that some mild reflux could be involved also. Your lungs are clear today and your oxygen level is normal. Add antihistamine once a day or night for a week and see if it helps. Continue on the nose spray.  Is not helping then add over-the-counter Prilosec one a day preferred 30-60 minutes before a meal to help with efficacy.  Is not helping after 2-3 weeks in followup after checkup.  In the meantime you can call if fever wheezing or other symptoms. Someone will notify you about your colonoscopy referral appointments. Get a copy of your records regarding  polyps.

## 2011-04-07 NOTE — Assessment & Plan Note (Signed)
Was told she was on a 5 year recall from her previous gastroenterologist. Due 2012

## 2011-04-07 NOTE — Progress Notes (Signed)
Subjective:    Patient ID: Tamara Watkins, female    DOB: Aug 04, 1966, 45 y.o.   MRN: [id number]  HPI Patient comes in today for an acute visit. She has had some chronic nasal stuffiness and possible drip for a while. She is on Flonase. For the last 5-6 days she is found with feels like she cannot get a deep breath and some irritation in her lower throat upper chest. She denies sore throat fever wheezing and dyspnea on exertion.  She may cough occasionally when she takes a deep breath no unusual nocturnal symptoms. No specific heartburn but had questions reflux.  She has increasing fatigue recently. Has not tried an antihistamine.  She has been weaning off of the Lexapro because it hasn't been not helpful and thinks there is a little anxiety involved with her symptoms but is unclear how much.   Has a history of colonic polyps and was told that she was due for a another colonoscopy this year. Needs a referral for this.   Review of Systems Negative for fever snoring sleep apnea. No sweats weight loss unusual bleeding.  Does cough when she laughs; no history of asthma  rest of ROS noncontributory  . Past Medical History  Diagnosis Date  . Headache   . Migraine   . Hyperlipidemia   . Urinary incontinence     related to childbirth urge and stress Tamara Watkins helps  . Hx of skin cancer, basal cell   . Hx of colonic polyps     5 year recall history of rectal bleeding  . History of syncope     with low bp orthostatic onset hs   Past Surgical History  Procedure Date  . Cesarean section   . Dilation and curettage of uterus     for miscarrage    reports that she has never smoked. She does not have any smokeless tobacco history on file. She reports that she drinks alcohol. She reports that she does not use illicit drugs. family history includes Allergies in an unspecified family member; Anxiety disorder in her daughter; Colon polyps in her father; Depression in her brother, daughter, and father;  Hyperlipidemia in her mother; Hypertension in her brother and father; and Other in her father. Allergies  Allergen Reactions  . Codeine   . Levaquin     Sob and dizziness       Objective:   Physical Exam Well-developed well-nourished in no acute distress.HEENT: Normocephalic ;atraumatic , Eyes;  PERRL, EOMs  Full, lids and conjunctiva clear,,Ears: no deformities, canals nl, TM landmarks normal, Nose: no deformity or discharge slight congestion  Mouth : OP clear without lesion or edema .MIldly hoarse  Neck non tender no thyromegaly  Mass supple. Chest:  Clear to A&P without wheezes rales or rhonchi deep breathing causes some dizziness. No cough noted on deep breathing CV:  S1-S2 no gallops or murmurs peripheral perfusion is normal Abdomen:  Sof,t normal bowel sounds without hepatosplenomegaly, no guarding rebound or masses no CVA tenderness Skin Good color and cap refill   Neuro non focal intact  Psych oriented  Oriented x 3 and no noted deficits in memory, attention, and speech.    Assessment & Plan:  Feeling of air hunger o nasal congestion fatigue.  This still could be allergic drip problem or a variant of asthma however she has a normal exam except for slight nasal congestion. Suspect anxiety that is secondary adding to the problem giving her fatigue.  It is still possible there is  esophageal reflux also with throat clearing and moderate hoarseness. Will add antihistamine first and then over-the-counter Prilosec. She is coming in a month for a checkup and we'll reassess at that time. Alarm symptoms reviewed. Anxiety  weaning from Lexapro. History of colonic polyps  apparently due for a repeat colonoscopy. Recommend she get her records and we will refer.   Total visit > 50% spent counseling and coordinating care

## 2011-04-19 ENCOUNTER — Encounter: Payer: Self-pay | Admitting: Internal Medicine

## 2011-04-20 ENCOUNTER — Ambulatory Visit: Payer: Managed Care, Other (non HMO) | Admitting: Internal Medicine

## 2011-04-28 ENCOUNTER — Ambulatory Visit (INDEPENDENT_AMBULATORY_CARE_PROVIDER_SITE_OTHER): Payer: Managed Care, Other (non HMO) | Admitting: Internal Medicine

## 2011-04-28 ENCOUNTER — Encounter: Payer: Self-pay | Admitting: Internal Medicine

## 2011-04-28 VITALS — BP 100/72 | HR 89 | Temp 98.9°F | Wt 150.0 lb

## 2011-04-28 DIAGNOSIS — R0609 Other forms of dyspnea: Secondary | ICD-10-CM

## 2011-04-28 DIAGNOSIS — R0989 Other specified symptoms and signs involving the circulatory and respiratory systems: Secondary | ICD-10-CM

## 2011-04-28 DIAGNOSIS — J020 Streptococcal pharyngitis: Secondary | ICD-10-CM

## 2011-04-28 DIAGNOSIS — J029 Acute pharyngitis, unspecified: Secondary | ICD-10-CM

## 2011-04-28 DIAGNOSIS — E049 Nontoxic goiter, unspecified: Secondary | ICD-10-CM

## 2011-04-28 LAB — POCT RAPID STREP A (OFFICE): Rapid Strep A Screen: POSITIVE — AB

## 2011-04-28 MED ORDER — AMOXICILLIN 500 MG PO CAPS: 500.0000 mg | ORAL_CAPSULE | Freq: Two times a day (BID) | ORAL | Status: AC

## 2011-04-28 NOTE — Patient Instructions (Signed)
Take antibiotic.   Talk with Dr Juanda Chance about possible   Esophageal  Cause of discomfort.

## 2011-04-28 NOTE — Progress Notes (Signed)
  Subjective:    Patient ID: Tamara Watkins, female    DOB: [birth year], 45 y.o.   MRN: [id number]  HPI Patient comes in today for an acute visit. Her 2 children have recently been diagnosed with strep throat. Her symptoms are somewhat of a sore throat and a stomachache with no fever. She is having some increasing congestion in her throat with some sore throat but no fever nausea vomiting. No true dysphasia speaking difficulty fever chills unusual rash She continues to have the feeling like she can't breathe in her throat that she was here for her last time. She doesn't feel at this point it is from anxiety she is on medication for this. She does have had some anxiety but is unsure that this is really the cause of her symptoms.  She is to have a colonoscopy soon and has some occasional reflux.   Review of Systems No vomiting diarrhea stridor. See history of present illness  Past history family history social history reviewed in the electronic medical record.     Objective:   Physical Exam Well-developed well-nourished in no acute distress looks pretty well. HEENT: Normocephalic ;atraumatic , Eyes;  PERRL, EOMs  Full, lids and conjunctiva clear,,Ears: no deformities, canals nl, TM landmarks normal, Nose: no deformity or discharge  Mouth : OP clear without lesion or edema . She has some redness bilaterally but no exudate or palatal petechiae Neck - No masses  or limitation in range of motion shotty a.c. nodes. Thyroid is palpable over the tracheal cartilage but nontender. There is no nodule. Supra-sternal notch seems normal. Chest:  Clear to A&P without wheezes rales or rhonchi CV:  S1-S2 no gallops or murmurs peripheral perfusion is normal Skin no acute rashes   Assessment & Plan:  Streptococcal pharyngitis Symptoms are not severe but she has a strong exposure and should be treated. I don't really think this is a carrier state.  Feeling of not breathing right related to lower throat /neck  area Reassurance that her airway is good because she can speak well hold her breath and has no other findings on her exam except palpable thyroid. As possible she could have some esophageal laryngeal reflux ... we'll be getting a full set of labs including a thyroid test for her checkup in the next 2 weeks. Consider a right ultrasound imaging of this area and / or ENT evaluation  Will add on Free t4 and thyroid antibodies to cpx labs.

## 2011-05-10 ENCOUNTER — Other Ambulatory Visit (INDEPENDENT_AMBULATORY_CARE_PROVIDER_SITE_OTHER): Payer: Managed Care, Other (non HMO)

## 2011-05-10 DIAGNOSIS — Z0000 Encounter for general adult medical examination without abnormal findings: Secondary | ICD-10-CM

## 2011-05-10 DIAGNOSIS — E049 Nontoxic goiter, unspecified: Secondary | ICD-10-CM

## 2011-05-10 LAB — POCT URINALYSIS DIPSTICK
Bilirubin, UA: NEGATIVE
Blood, UA: NEGATIVE
Glucose, UA: NEGATIVE
Ketones, UA: NEGATIVE
Nitrite, UA: NEGATIVE
Spec Grav, UA: 1.02
Urobilinogen, UA: 0.2
pH, UA: 7

## 2011-05-10 LAB — BASIC METABOLIC PANEL
BUN: 11 mg/dL (ref 6–23)
CO2: 28 mEq/L (ref 19–32)
Calcium: 9 mg/dL (ref 8.4–10.5)
Chloride: 105 mEq/L (ref 96–112)
Creatinine, Ser: 0.8 mg/dL (ref 0.4–1.2)
GFR: 79.93 mL/min (ref >60.00)
Glucose, Bld: 93 mg/dL (ref 70–99)
Potassium: 4.2 mEq/L (ref 3.5–5.1)
Sodium: 139 mEq/L (ref 135–145)

## 2011-05-10 LAB — LIPID PANEL
Cholesterol: 216 mg/dL — ABNORMAL HIGH (ref 0–200)
HDL: 60.3 mg/dL (ref >39.00)
Total CHOL/HDL Ratio: 4
Triglycerides: 86 mg/dL (ref 0.0–149.0)
VLDL: 17.2 mg/dL (ref 0.0–40.0)

## 2011-05-10 LAB — HEPATIC FUNCTION PANEL
ALT: 43 U/L — ABNORMAL HIGH (ref 0–35)
AST: 35 U/L (ref 0–37)
Albumin: 4.3 g/dL (ref 3.5–5.2)
Alkaline Phosphatase: 70 U/L (ref 39–117)
Bilirubin, Direct: 0.1 mg/dL (ref 0.0–0.3)
Total Bilirubin: 0.5 mg/dL (ref 0.3–1.2)
Total Protein: 7.4 g/dL (ref 6.0–8.3)

## 2011-05-10 LAB — CBC WITH DIFFERENTIAL/PLATELET
Basophils Absolute: 0 10*3/uL (ref 0.0–0.1)
Basophils Relative: 0.6 % (ref 0.0–3.0)
Eosinophils Absolute: 0.1 10*3/uL (ref 0.0–0.7)
Eosinophils Relative: 1.2 % (ref 0.0–5.0)
HCT: 40 % (ref 36.0–46.0)
Hemoglobin: 13.7 g/dL (ref 12.0–15.0)
Lymphocytes Relative: 53.9 % — ABNORMAL HIGH (ref 12.0–46.0)
Lymphs Abs: 2.7 10*3/uL (ref 0.7–4.0)
MCHC: 34.2 g/dL (ref 30.0–36.0)
MCV: 91.5 fl (ref 78.0–100.0)
Monocytes Absolute: 0.4 10*3/uL (ref 0.1–1.0)
Monocytes Relative: 7.4 % (ref 3.0–12.0)
Neutro Abs: 1.8 10*3/uL (ref 1.4–7.7)
Neutrophils Relative %: 36.9 % — ABNORMAL LOW (ref 43.0–77.0)
Platelets: 278 10*3/uL (ref 150.0–400.0)
RBC: 4.37 Mil/uL (ref 3.87–5.11)
RDW: 13.4 % (ref 11.5–14.6)
WBC: 5 10*3/uL (ref 4.5–10.5)

## 2011-05-10 LAB — LDL CHOLESTEROL, DIRECT: Direct LDL: 139.5 mg/dL

## 2011-05-10 LAB — TSH: TSH: 1.56 u[IU]/mL (ref 0.35–5.50)

## 2011-05-11 LAB — T4, FREE: Free T4: 0.79 ng/dL (ref 0.60–1.60)

## 2011-05-19 ENCOUNTER — Encounter: Payer: Self-pay | Admitting: Internal Medicine

## 2011-05-22 ENCOUNTER — Ambulatory Visit (INDEPENDENT_AMBULATORY_CARE_PROVIDER_SITE_OTHER): Payer: Managed Care, Other (non HMO) | Admitting: Internal Medicine

## 2011-05-22 ENCOUNTER — Encounter: Payer: Self-pay | Admitting: Internal Medicine

## 2011-05-22 ENCOUNTER — Ambulatory Visit (AMBULATORY_SURGERY_CENTER): Payer: Managed Care, Other (non HMO) | Admitting: *Deleted

## 2011-05-22 VITALS — BP 110/70 | HR 66 | Ht 66.0 in | Wt 151.0 lb

## 2011-05-22 VITALS — Ht 67.0 in | Wt 150.8 lb

## 2011-05-22 DIAGNOSIS — Z86010 Personal history of colonic polyps: Secondary | ICD-10-CM

## 2011-05-22 DIAGNOSIS — Z0000 Encounter for general adult medical examination without abnormal findings: Secondary | ICD-10-CM

## 2011-05-22 DIAGNOSIS — F410 Panic disorder [episodic paroxysmal anxiety] without agoraphobia: Secondary | ICD-10-CM

## 2011-05-22 DIAGNOSIS — J029 Acute pharyngitis, unspecified: Secondary | ICD-10-CM

## 2011-05-22 DIAGNOSIS — R945 Abnormal results of liver function studies: Secondary | ICD-10-CM

## 2011-05-22 DIAGNOSIS — E049 Nontoxic goiter, unspecified: Secondary | ICD-10-CM

## 2011-05-22 NOTE — Progress Notes (Signed)
Pt here for PV today for colonoscopy.  Has history of hyperplastic colon polyps. Father has history of colon polyps.  Last colonoscopy 2007 in [place], [place].  Pt  Has report from last procedure. Pt says that she did not sleep during her last colonoscopy and wants to have better sedation with next colonoscopy.  I talked with her about Propofol for next colonoscopy.  Will give report from last procedure to Ronny Bacon, CMA for Dr. Juanda Chance to review. While in PV pt says that she feels like she has a lump in her throat when she swallows and can "feel acid in her throat sometimes."  She also is having rectal pain and rare rectal bleeding.  I have cancelled pt's colonoscopy and scheduled office visit with Dr. Juanda Chance.  Tamara Watkins

## 2011-05-22 NOTE — Progress Notes (Signed)
Subjective:    Patient ID: Tamara Watkins, female    DOB: [birth year], 45 y.o.   MRN: [id number]  HPI Patient comes in today for preventive visit and follow-up of medical issues. Update of her history since her last visit. Throat is better  And on  Low dose  lexapro doing better with anxiety   But still feels some pressure at the base of her throat at times. Also this and now stll has some pressure  At base of throat.  ? If adenoids are an issue because she feels some congestion at the back of her throat.   can hear well Flies a lot.  No ear pain. She does have an appointment with Dr. Juanda Chance in August before getting a colonoscopy Anxiety: Went back on 5 mg of Lexapro and feels much better. This makes it easier to isolate the above symptoms.  Has questions about probiotics and if they would help with serotonin. When she had her laboratory studies she's had an acute illness respiratory with cough that has since resolved.  Review of Systems ROS:  GEN/ HEENTNo fever, significant weight changes sweats headaches vision problems except?need for reading glasses,  hearing changes, CV/ PULM; No chest pain shortness of breath cough, syncope,edema  change in exercise tolerance. GI /GU: No adominal pain, vomiting, change in bowel habits. No blood in the stool. No significant GU symptoms. No true dysphasia just feeling of pressure or in the front of her throat at times. SKIN/HEME: ,no acute skin rashes suspicious lesions or bleeding. No lymphadenopathy, nodules, masses.  NEURO/ PSYCH:  No neurologic signs such as weakness numbness No depression anxiety. IMM/ Allergy: No unusual infections.  Allergy .   Back pain at times better  REST of 12 system review negative except as per history of present illness  Past history family history social history reviewed in the electronic medical record.     Objective:   Physical Exam Physical Exam: Vital signs reviewed [id number] is a well-developed well-nourished alert  cooperative  white female who appears her stated age in no acute distress.  HEENT: normocephalic  traumatic , Eyes: PERRL EOM's full, conjunctiva clear, Nares: paten,t no deformity discharge or tenderness., Ears: no deformity EAC's clear TMs with normal landmarks. Mouth: clear OP, no lesions, edema.  Moist mucous membranes. Dentition in adequate repair. NECK: supple , thyroid  Is palpable but no nodules  Or tenderness or bruits. CHEST/PULM:  Clear to auscultation and percussion breath sounds equal no wheeze , rales or rhonchi. No chest wall deformities or tenderness. CV: PMI is nondisplaced, S1 S2 no gallops, murmurs, rubs. Peripheral pulses are full without delay.No JVD .  Breast: normal by inspection . No dimpling, discharge, masses, tenderness or discharge .  ABDOMEN: Bowel sounds normal nontender  No guard or rebound, no hepato splenomegal no CVA tenderness.  No hernia. Extremtities:  No clubbing cyanosis or edema, no acute joint swelling or redness no focal atrophy NEURO:  Oriented x3, cranial nerves 3-12 appear to be intact, no obvious focal weakness,gait within normal limits no abnormal reflexes or asymmetrical SKIN: No acute rashes normal turgor, color, no bruising or petechiae. PSYCH: Oriented, good eye contact, no obvious depression anxiety, cognition and judgment appear normal.     Labs reviewed    Slight elevation lfts  Lymphocytosis   tsh is normal.   Assessment & Plan:  Preventive Health Care Counseled regarding healthy nutrition, exercise, sleep, injury prevention, calcium vit d and healthy weight . UTD  Pap per gyne Continue  lifestyle intervention healthy eating and exercise .  Throat sx and Mild goiter  With nl tfts.  Unsure  Cause  Also related ? Some sx in upper soft palate area.   No alarm features  Will get [place] and ent check / if atypical reflux or other.  Anxiety  Much better and  prob not causing above now.  Minor abn LFTS  prob from illness will follow and avoid  hepatotoxins

## 2011-05-22 NOTE — Assessment & Plan Note (Signed)
Minor abnormality possibly from viral illness that she had

## 2011-05-22 NOTE — Patient Instructions (Addendum)
Continue on the low dose   lexapro Continue lifestyle intervention healthy eating and exercise .  Will contact you about  Thyroid  Ultrasound and ENT check  Otherwise   Recheck liver tests in a months  And avoid  advil and tylenol type medications. FU depending on results .   Check 6 months med check   Or as needed

## 2011-06-01 ENCOUNTER — Other Ambulatory Visit: Payer: Managed Care, Other (non HMO)

## 2011-06-05 ENCOUNTER — Other Ambulatory Visit: Payer: Managed Care, Other (non HMO) | Admitting: Internal Medicine

## 2011-06-12 ENCOUNTER — Ambulatory Visit
Admission: RE | Admit: 2011-06-12 | Discharge: 2011-06-12 | Disposition: A | Discharge status: Home / Self Care | Payer: Managed Care, Other (non HMO) | Source: Ambulatory Visit | Attending: Internal Medicine | Admitting: Internal Medicine

## 2011-06-12 DIAGNOSIS — E049 Nontoxic goiter, unspecified: Secondary | ICD-10-CM

## 2011-06-12 DIAGNOSIS — J029 Acute pharyngitis, unspecified: Secondary | ICD-10-CM

## 2011-06-26 ENCOUNTER — Other Ambulatory Visit: Payer: Managed Care, Other (non HMO)

## 2011-07-04 ENCOUNTER — Other Ambulatory Visit (INDEPENDENT_AMBULATORY_CARE_PROVIDER_SITE_OTHER): Payer: Managed Care, Other (non HMO)

## 2011-07-04 DIAGNOSIS — R799 Abnormal finding of blood chemistry, unspecified: Secondary | ICD-10-CM

## 2011-07-04 DIAGNOSIS — R7989 Other specified abnormal findings of blood chemistry: Secondary | ICD-10-CM

## 2011-07-04 LAB — HEPATIC FUNCTION PANEL
ALT: 20 U/L (ref 0–35)
AST: 20 U/L (ref 0–37)
Albumin: 3.9 g/dL (ref 3.5–5.2)
Alkaline Phosphatase: 55 U/L (ref 39–117)
Bilirubin, Direct: 0 mg/dL (ref 0.0–0.3)
Total Bilirubin: 0.6 mg/dL (ref 0.3–1.2)
Total Protein: 6.8 g/dL (ref 6.0–8.3)

## 2011-07-10 ENCOUNTER — Ambulatory Visit (INDEPENDENT_AMBULATORY_CARE_PROVIDER_SITE_OTHER): Payer: Managed Care, Other (non HMO) | Admitting: Internal Medicine

## 2011-07-10 ENCOUNTER — Encounter: Payer: Self-pay | Admitting: Internal Medicine

## 2011-07-10 VITALS — BP 102/62 | HR 80 | Ht 67.0 in | Wt 148.0 lb

## 2011-07-10 DIAGNOSIS — Z86010 Personal history of colonic polyps: Secondary | ICD-10-CM

## 2011-07-10 DIAGNOSIS — K625 Hemorrhage of anus and rectum: Secondary | ICD-10-CM

## 2011-07-10 NOTE — Progress Notes (Signed)
Tamara Watkins 05-09-1966 MRN [id number]    History of Present Illness:  This is a 45 year old white female with a family history of colon polyps and personal history of hyperplastic polyps on a colonoscopy in August 2007 in [place], [place] which was done for rectal bleeding, 1 year after she delivered her daughter. She remains asymptomatic. The bleeding was attributed to hemorrhoids. She was told to have a repeat colonoscopy in 5 years. She has a history of gastroesophageal reflux. She takes fiber supplements and Xanax when necessary.   Past Medical History  Diagnosis Date  . Headache   . Migraine   . Hyperlipidemia   . Urinary incontinence     related to childbirth urge and stress Tamara Watkins helps  . Hx of skin cancer, basal cell   . Hx of colonic polyps     5 year recall history of rectal bleeding  . History of syncope     with low bp orthostatic onset hs  . Anxiety   . GERD (gastroesophageal reflux disease)     pt hew symptoms, difficulty swallowing, lump in throat  . Internal hemorrhoids    Past Surgical History  Procedure Date  . Cesarean section   . Dilation and curettage of uterus     for miscarrage  . Destruction of lesion anal     reports that she has never smoked. She has never used smokeless tobacco. She reports that she drinks about 4.8 ounces of alcohol per week. She reports that she does not use illicit drugs. family history includes Allergies in an unspecified family member; Anxiety disorder in her daughter; Colon polyps in her father and paternal grandmother; Depression in her brother, daughter, and father; Hyperlipidemia in her mother; Hypertension in her brother and father; and Other in her father. Allergies  Allergen Reactions  . Codeine Other (See Comments)    Abdominal pain  . Levaquin     Sob and dizziness        Review of Systems: Denies heartburn, dysphagia, odynophagia, chest pain shortness of breath or abdominal pain  The remainder of the 10  point ROS is  negative except as outlined in H&P   Physical Exam: General appearance  Well developed, in no distress. Eyes- non icteric. HEENT nontraumatic, normocephalic. Mouth no lesions, tongue papillated, no cheilosis. Neck supple without adenopathy, thyroid not enlarged, no carotid bruits, no JVD. Lungs Clear to auscultation bilaterally. Cor normal S1 normal S2, regular rhythm , no murmur,  quiet precordium. Abdomen soft scaphoid abdomen with normal active bowel sounds. No distention. Mild tenderness left lower quadrant. Liver edge at costal margin. Rectal: 2 skin tags in the anal orifice. Normal rectal sphincter tone. Soft Hemoccult-negative stool. No rectal mass. Extremities no pedal edema. Skin no lesions. Neurological alert and oriented x 3. Psychological normal mood and affect.  Assessment and Plan:   Problem #79 45 year old white female with a family history of colon polyps in her father and a personal history of hyperplastic polyp. She had a colonoscopy 5 years ago. According to the guidelines, she will be due for a recall colonoscopy in August 2014. She is currently asymptomatic. She has small external hemorrhoids. She will continue on fiber supplements. She will receive a recall letter in July 2014 for an August 2014.   07/10/2011 Tamara Watkins

## 2011-07-10 NOTE — Patient Instructions (Addendum)
.  You will be due for a recall colonoscopy in 06/2013. We will send you a reminder in the mail when it gets closer to that time. CC: Dr Fabian Sharp

## 2011-07-11 ENCOUNTER — Telehealth: Payer: Self-pay | Admitting: *Deleted

## 2011-07-11 NOTE — Telephone Encounter (Signed)
Message copied by Romualdo Bolk on Tue Jul 11, 2011 11:53 AM ------      Message from: [facility] [place] [facility]      Created: Mon Jul 10, 2011  9:33 PM       Tell patient her thyroid ultrasound showed normal thyroid with some very small nodules prob of no clinical consequence. Also her liver tests are now normal.      Ask her if she has seen ENT  Yet.

## 2011-07-11 NOTE — Telephone Encounter (Signed)
Pt aware of results. Pt saw ENT and they couldn't find anything wrong either

## 2011-07-11 NOTE — Telephone Encounter (Signed)
Left message to call back  

## 2011-09-01 ENCOUNTER — Ambulatory Visit (INDEPENDENT_AMBULATORY_CARE_PROVIDER_SITE_OTHER): Payer: Managed Care, Other (non HMO)

## 2011-09-01 DIAGNOSIS — Z23 Encounter for immunization: Secondary | ICD-10-CM

## 2011-09-01 LAB — URINALYSIS, ROUTINE W REFLEX MICROSCOPIC
Bilirubin Urine: NEGATIVE
Glucose, UA: NEGATIVE mg/dL
Hgb urine dipstick: NEGATIVE
Ketones, ur: 15 mg/dL — AB
Nitrite: NEGATIVE
Protein, ur: NEGATIVE mg/dL
Specific Gravity, Urine: 1.008 (ref 1.005–1.030)
Urobilinogen, UA: 0.2 mg/dL (ref 0.0–1.0)
pH: 6.5 (ref 5.0–8.0)

## 2011-09-01 LAB — URINE MICROSCOPIC-ADD ON

## 2011-09-01 LAB — POCT PREGNANCY, URINE: Preg Test, Ur: NEGATIVE

## 2011-11-28 DIAGNOSIS — R931 Abnormal findings on diagnostic imaging of heart and coronary circulation: Secondary | ICD-10-CM

## 2011-11-28 HISTORY — DX: Abnormal findings on diagnostic imaging of heart and coronary circulation: R93.1

## 2011-11-28 HISTORY — PX: REDUCTION MAMMAPLASTY: SUR839

## 2012-03-01 ENCOUNTER — Encounter: Payer: Self-pay | Admitting: Internal Medicine

## 2012-03-01 ENCOUNTER — Ambulatory Visit (INDEPENDENT_AMBULATORY_CARE_PROVIDER_SITE_OTHER): Payer: Managed Care, Other (non HMO) | Admitting: Internal Medicine

## 2012-03-01 VITALS — BP 110/60 | HR 96 | Temp 98.2°F | Wt 152.0 lb

## 2012-03-01 DIAGNOSIS — N62 Hypertrophy of breast: Secondary | ICD-10-CM

## 2012-03-01 DIAGNOSIS — M549 Dorsalgia, unspecified: Secondary | ICD-10-CM

## 2012-03-01 DIAGNOSIS — J309 Allergic rhinitis, unspecified: Secondary | ICD-10-CM

## 2012-03-01 DIAGNOSIS — M542 Cervicalgia: Secondary | ICD-10-CM

## 2012-03-01 DIAGNOSIS — G43809 Other migraine, not intractable, without status migrainosus: Secondary | ICD-10-CM | POA: Insufficient documentation

## 2012-03-01 DIAGNOSIS — G8929 Other chronic pain: Secondary | ICD-10-CM | POA: Insufficient documentation

## 2012-03-01 DIAGNOSIS — J302 Other seasonal allergic rhinitis: Secondary | ICD-10-CM | POA: Insufficient documentation

## 2012-03-01 HISTORY — DX: Hypertrophy of breast: N62

## 2012-03-01 MED ORDER — FLUTICASONE PROPIONATE 50 MCG/ACT NA SUSP: 2.0000 | Freq: Every day | NASAL | Status: DC

## 2012-03-01 NOTE — Progress Notes (Signed)
  Subjective:    Patient ID: Tamara Watkins, female    DOB: 1966/09/07, 46 y.o.   MRN: [id number]  HPI Pt comes in today for a few concerns , Has had neck and upper back pain for years sometimes contributing to her headaches in the past. Has tried  Massage and exercises .  Barel;y controls the chronic pain . Interested  about getting rx for  Neck and back pain from gynecomastia .     Has met with surgeon about problem .  Has been to chiro in the past and has back pain" forever. "  Takes advil and continues . Gets rashes  Under breast and uses baby powder.   New problem today right Ear pain nose congestion and face pressure.   Not like a cold.  In the past otc.   Never used nasal cortisone.  Feels like could be allergy . Asks advice about this Review of Systems No fever cp sob syncope   . Anxiety is stable  Eats healthy . Recently had left index finger injury and has this in a splint.  Past history family history social history reviewed in the electronic medical record.     Objective:   Physical Exam  BP 110/60  Pulse 96  Temp 98.2 F (36.8 C)  Wt 152 lb (68.947 kg)  SpO2 98%  LMP 02/29/2012  WDWN in nad  HEENT: Normocephalic ;atraumatic , Eyes;  PERRL, EOMs  Full, lids and conjunctiva clear,,Ears: no deformities, canals nl, TM landmarks normal, Nose: no deformity or discharge congested face non tender  Mouth : OP clear without lesion or edema . Neck: Supple without adenopathy or masses or bruits  Sig muscle spasm at right cervical paracervical insertion. Chest:  Clear to A&P without wheezes rales or rhonchi CV:  S1-S2 no gallops or murmurs peripheral perfusion is normal Tight muscle trapezius.  Neuro grossly intact.  Left index finger in splint    Assessment & Plan:  Neck and back pain.    Agree  triggered by  Gynecomastia.  Using advil on a reg basis and massages. Continues . Poss aggravate cervicogenic headaches .   Has used many modalities  Will refer for PT  Integrative  therapies preferred if covered .   UR congestion : Allergic  Probable with nose congestion and ear issue. Add INCS and antihistamine and   Expectant management. And fu if  persistent or progressive

## 2012-03-01 NOTE — Patient Instructions (Signed)
For allergy  nasal cortisone.   May be the best   controller . Also can take otc claritin or allegra or zyrtec.   Will arrange a physical therapy referral for the neck back pain.  You are doing the right things  And agree that gynecomastia is contributing to this problem.  Sign release to have med records sent to your specialist .

## 2012-04-26 ENCOUNTER — Ambulatory Visit (INDEPENDENT_AMBULATORY_CARE_PROVIDER_SITE_OTHER): Payer: Managed Care, Other (non HMO) | Admitting: Internal Medicine

## 2012-04-26 ENCOUNTER — Encounter: Payer: Self-pay | Admitting: Internal Medicine

## 2012-04-26 VITALS — BP 98/70 | HR 101 | Temp 98.7°F | Wt 152.0 lb

## 2012-04-26 DIAGNOSIS — S6990XA Unspecified injury of unspecified wrist, hand and finger(s), initial encounter: Secondary | ICD-10-CM

## 2012-04-26 DIAGNOSIS — M542 Cervicalgia: Secondary | ICD-10-CM

## 2012-04-26 DIAGNOSIS — N62 Hypertrophy of breast: Secondary | ICD-10-CM

## 2012-04-26 DIAGNOSIS — Z01818 Encounter for other preprocedural examination: Secondary | ICD-10-CM

## 2012-04-26 DIAGNOSIS — S6980XA Other specified injuries of unspecified wrist, hand and finger(s), initial encounter: Secondary | ICD-10-CM

## 2012-04-26 DIAGNOSIS — G8929 Other chronic pain: Secondary | ICD-10-CM

## 2012-04-26 NOTE — Patient Instructions (Signed)
Your EKG is normal today. No medical contraindications to surgery. Agree with medical reasons for intervention.  In regard to your index finger get an x-ray to rule out abnormality try buddy taping as a splint to protect the finger joint if not improving. Contact us consider having hand surgeon evaluate your index finger.

## 2012-04-26 NOTE — Progress Notes (Signed)
  Subjective:    Patient ID: Tamara Watkins, female    DOB: [birth year], 46 y.o.   MRN: [id number]  HPI Patient comes in for preoperative clearance evaluation. She is going to go ahead with breast reduction because of continued symptoms and her insurance will not cover the procedure even if medically indicated. She did do physical therapy with some help although acupuncture was not helpful. Finger injury not better. About 2 months ago she fell left index finger swelled some was checked by a medical person and put in a splint. Since that time she's had recurrent pain when she is out of the splint for long period. Migraines and headaches Doing better     Surgery June 12 th  At the [facility] center [phone]    Review of Systems ROS:  GEN/ HEENT: No fever, significant weight changes sweats headaches vision problems hearing changes, CV/ PULM; No chest pain shortness of breath cough, syncope,edema  change in exercise tolerance. GI /GU: No adominal pain, vomiting, change in bowel habits. No blood in the stool. No significant GU symptoms. SKIN/HEME: ,no acute skin rashes suspicious lesions or bleeding. No lymphadenopathy, nodules, masses.  NEURO/ PSYCH:  No neurologic signs such as weakness numbness. No depression anxiety. IMM/ Allergy: No unusual infections.   REST of 12 system review negative except as per HPI Past history family history social history reviewed in the electronic medical record.      Objective:   Physical Exam BP 98/70  Pulse 101  Temp(Src) 98.7 F (37.1 C) (Oral)  Wt 152 lb (68.947 kg)  SpO2 98%  LMP 04/26/2012  wdwn in nad HEENT grossly normal Neck: Supple without adenopathy or masses or bruits tight trapezius and bra strap sign Chest:  Clear to A&P without wheezes rales or rhonchi CV:  S1-S2 no gallops or murmurs peripheral perfusion is normal Breast: normal by inspection . Large pendulous  No dimpling, discharge, masses, tenderness or discharge . Abdomen:   Sof,t normal bowel sounds without hepatosplenomegaly, no guarding rebound or masses no CVA tenderness No clubbing cyanosis or edema Left index finger  In spint  Minimal swelling tender at volar aspect  No ov lig instability  No redness EKG NSR  Copy to patient    Assessment & Plan:  Gynecomastia causing neck pain and chest symptoms. Preoperative evaluation  no contraindications EKG normal today.  Left index finger injury. Clinically looks pretty good however continued pain after 2 months would indicate we will get an x-ray consider buddy taping for protection and gentle range of motion exercises. If still not improving contact us consider hand consult.

## 2012-05-02 ENCOUNTER — Telehealth: Payer: Self-pay | Admitting: Family Medicine

## 2012-05-02 NOTE — Telephone Encounter (Signed)
We received notification that this pt did not have the finger xray done, ordered 04/26/12.

## 2012-05-02 NOTE — Telephone Encounter (Signed)
Called and spoke with pt and pt states she had to unexpectedly go out of town and just returned.  Pt states she will get the x-ray done soon.

## 2012-06-05 ENCOUNTER — Ambulatory Visit: Payer: Managed Care, Other (non HMO) | Admitting: Cardiovascular Disease

## 2012-07-23 ENCOUNTER — Ambulatory Visit: Payer: Managed Care, Other (non HMO) | Admitting: Cardiovascular Disease

## 2012-08-19 ENCOUNTER — Ambulatory Visit: Payer: Managed Care, Other (non HMO) | Admitting: Cardiovascular Disease

## 2012-09-06 ENCOUNTER — Ambulatory Visit (INDEPENDENT_AMBULATORY_CARE_PROVIDER_SITE_OTHER): Payer: Managed Care, Other (non HMO) | Admitting: Cardiovascular Disease

## 2012-09-06 ENCOUNTER — Encounter: Payer: Self-pay | Admitting: Cardiovascular Disease

## 2012-09-06 VITALS — BP 115/79 | HR 71 | Wt 149.0 lb

## 2012-09-06 DIAGNOSIS — R002 Palpitations: Secondary | ICD-10-CM

## 2012-09-06 NOTE — Patient Instructions (Addendum)
Your physician has requested that you have an exercise tolerance test. For further information please visit [email or web address] Please also follow instruction sheet, as given.  Your physician has recommended a coronary calcium score.   Your physician recommends that you schedule a follow-up appointment as needed.

## 2012-09-06 NOTE — Progress Notes (Signed)
HPI:  46 year old woman presenting for initial cardiac evaluation. She has been previously healthy. She has a history of hypercholesterolemia, but has not required medication. She has no past history of cardiac problems. She specifically denies chest pain, chest pressure, dyspnea, orthopnea, PND, or calf claudication symptoms. She has never been a smoker. She does have a history of palpitations with no recent change in symptoms. She's had lightheadedness but no recent episodes of syncope. She has occasional swelling in her ankles. She has no history of hypertension or diabetes. She wants to have a general assessment of her cardiovascular health.  Outpatient Encounter Prescriptions as of 09/06/2012  Medication Sig Dispense Refill  . ALPRAZolam (XANAX) 0.25 MG tablet Take 1 tablet (0.25 mg total) by mouth 3 (three) times daily as needed for sleep. For panic attack  30 tablet  0  . LEXAPRO 10 MG tablet Take 10 mg by mouth Daily. Takes 1/2 tablet daily      . Naproxen Sodium (ALEVE PO) Take by mouth as needed.      Marland Kitchen DISCONTD: fish oil-omega-3 fatty acids 1000 MG capsule Take 2 g by mouth daily.        Marland Kitchen DISCONTD: fluticasone (FLONASE) 50 MCG/ACT nasal spray Place 2 sprays into the nose daily.  16 g  6  . DISCONTD: ibuprofen (ADVIL,MOTRIN) 200 MG tablet Take 200 mg by mouth every 6 (six) hours as needed.      Marland Kitchen DISCONTD: MULTIPLE VITAMIN PO Take by mouth.        . DISCONTD: naproxen sodium (ANAPROX) 220 MG tablet Take 220 mg by mouth 2 (two) times daily with a meal.      . DISCONTD: polycarbophil (FIBERCON) 625 MG tablet Take 625 mg by mouth daily.          Codeine and Levofloxacin  Past Medical History  Diagnosis Date  . Headache   . Migraine   . Hyperlipidemia   . Urinary incontinence     related to childbirth urge and stress [name] helps  . Hx of skin cancer, basal cell   . Hx of colonic polyps     5 year recall history of rectal bleeding  . History of syncope     with low bp  orthostatic onset hs  . Anxiety   . GERD (gastroesophageal reflux disease)     pt hew symptoms, difficulty swallowing, lump in throat  . Internal hemorrhoids     Past Surgical History  Procedure Date  . Cesarean section   . Dilation and curettage of uterus     for miscarrage  . Destruction of lesion anal     History   Social History  . Marital Status: Married    Spouse Name: N/A    Number of Children: N/A  . Years of Education: N/A   Occupational History  . Not on file.   Social History Main Topics  . Smoking status: Never Smoker   . Smokeless tobacco: Never Used  . Alcohol Use: 4.2 oz/week    7 Glasses of wine per week  . Drug Use: No  . Sexually Active: Not on file   Other Topics Concern  . Not on file   Social History Narrative   Occupation: Programme researcher, broadcasting/film/video- Air cabin crew, Masters level education, Merchant navy officer 4 days per weekMarriedRegular exercise- yesHH of 4No petsRecently moved from [place] glass wine per night    Family History  Problem Relation Age of Onset  . Hyperlipidemia Mother   . Depression Father   .  Hypertension Father   . Other Father     tranverse mylitis  . Depression Brother   . Hypertension Brother   . Depression Daughter   . Anxiety disorder Daughter   . Colon polyps Father   . Allergies      Children  . Colon polyps Paternal Grandmother     ROS: General: no fevers/chills/night sweats. Positive for generalized fatigue. Eyes: no blurry vision, diplopia, or amaurosis ENT: no sore throat or hearing loss Resp: no cough, wheezing, or hemoptysis CV: no edema or palpitations GI: no abdominal pain, nausea, vomiting, diarrhea, or constipation GU: no dysuria, frequency, or hematuria Skin: no rash Neuro: no headache, numbness, tingling, or weakness of extremities. Positive for anxiety. Musculoskeletal: no joint pain or swelling Heme: no bleeding, DVT, or easy bruising Endo: no polydipsia or polyuria  BP 115/79  Pulse 71  Wt 67.586 kg (149  lb)  PHYSICAL EXAM: Pt is alert and oriented, WD, WN, in no distress. HEENT: normal Neck: JVP normal. Carotid upstrokes normal without bruits. No thyromegaly. Lungs: equal expansion, clear bilaterally CV: Apex is discrete and nondisplaced, RRR without murmur or gallop Abd: soft, NT, +BS, no bruit, no hepatosplenomegaly Back: no CVA tenderness Ext: no C/C/E        Femoral pulses 2+= without bruits        DP/PT pulses intact and = Skin: warm and dry without rash Neuro: CNII-XII intact             Strength intact = bilaterally  EKG:  Normal sinus rhythm, within normal limits.  ASSESSMENT AND PLAN: 1. Palpitations. These appear stable and there is no physical exam evidence of structural heart disease. I have recommended an exercise treadmill study to rule out any ischemic changes or exercise-induced arrhythmias.  2. Hypercholesterolemia. Lipid panel from 2012 was reviewed. This demonstrated a total cholesterol 216, triglycerides 86, HDL 60, and LDL 139. We discussed consideration of screening studies for subclinical atherosclerosis. Will do a coronary CT calcium score. We discussed lifestyle modification for treatment of her hypercholesterolemia, unless her CT calcium score is elevated.  For followup, I would like to see the patient back as needed.  Tonny Bollman 09/17/2012 5:30 PM

## 2012-09-12 ENCOUNTER — Ambulatory Visit (INDEPENDENT_AMBULATORY_CARE_PROVIDER_SITE_OTHER): Payer: Managed Care, Other (non HMO)

## 2012-09-12 DIAGNOSIS — Z23 Encounter for immunization: Secondary | ICD-10-CM

## 2012-09-13 ENCOUNTER — Ambulatory Visit (INDEPENDENT_AMBULATORY_CARE_PROVIDER_SITE_OTHER)
Admission: RE | Admit: 2012-09-13 | Discharge: 2012-09-13 | Disposition: A | Discharge status: Home / Self Care | Payer: Managed Care, Other (non HMO) | Source: Ambulatory Visit | Attending: Cardiovascular Disease | Admitting: Cardiovascular Disease

## 2012-09-13 DIAGNOSIS — R002 Palpitations: Secondary | ICD-10-CM

## 2012-09-17 ENCOUNTER — Encounter: Payer: Self-pay | Admitting: Cardiovascular Disease

## 2012-09-17 NOTE — Telephone Encounter (Signed)
Follow-up:    Patient returned your call regarding her calcium results.  Please call back.

## 2012-09-17 NOTE — Telephone Encounter (Signed)
Left message to call back  

## 2012-09-18 NOTE — Telephone Encounter (Signed)
This encounter was created in error - please disregard.

## 2012-09-18 NOTE — Telephone Encounter (Signed)
New Problem: ° ° ° °Patient returned your call.  Please call back. °

## 2012-09-25 ENCOUNTER — Other Ambulatory Visit: Payer: Self-pay | Admitting: Internal Medicine

## 2012-09-27 ENCOUNTER — Encounter: Payer: Managed Care, Other (non HMO) | Admitting: Nurse Practitioner

## 2012-10-11 ENCOUNTER — Ambulatory Visit (INDEPENDENT_AMBULATORY_CARE_PROVIDER_SITE_OTHER): Payer: Managed Care, Other (non HMO) | Admitting: Physician Assistant

## 2012-10-11 DIAGNOSIS — R002 Palpitations: Secondary | ICD-10-CM

## 2012-10-11 NOTE — Progress Notes (Signed)
Exercise Treadmill Test  Pre-Exercise Testing Evaluation Rhythm: normal sinus  Rate: 75   PR:  .16 QRS:  .09  QT:  .38 QTc: .43           Test  Exercise Tolerance Test Ordering MD: Tonny Bollman, MD  Interpreting MD: Tereso Newcomer, PA-C  Unique Test No: 1  Treadmill:  1  Indication for ETT: Palpitations  Contraindication to ETT: No   Stress Modality: exercise - treadmill  Cardiac Imaging Performed: non   Protocol: standard Bruce - maximal  Max BP:  163/80  Max MPHR (bpm):  177 85% MPR (bpm):  150  MPHR obtained (bpm):  187 % MPHR obtained:  105  Reached 85% MPHR (min:sec):  6:40 Total Exercise Time (min-sec):  9:39  Workload in METS:  11.1 Borg Scale: 15  Reason ETT Terminated:  desired heart rate attained    ST Segment Analysis At Rest: normal ST segments - no evidence of significant ST depression With Exercise: no evidence of significant ST depression  Other Information Arrhythmia:  No Angina during ETT:  absent (0) Quality of ETT:  diagnostic  ETT Interpretation:  normal - no evidence of ischemia by ST analysis  Comments: Good exercise tolerance. No chest pain. Normal BP response to exercise. No ST-T changes to suggest ischemia.  No exercised induced arrhythmias.   Recommendations: Follow up with Dr. Tonny Bollman as directed. Signed,  Tereso Newcomer, PA-C  9:22 AM 10/11/2012

## 2012-10-30 ENCOUNTER — Encounter: Payer: Self-pay | Admitting: Internal Medicine

## 2012-10-30 ENCOUNTER — Ambulatory Visit (INDEPENDENT_AMBULATORY_CARE_PROVIDER_SITE_OTHER): Payer: Managed Care, Other (non HMO) | Admitting: Internal Medicine

## 2012-10-30 VITALS — BP 110/64 | HR 97 | Temp 98.1°F | Wt 150.0 lb

## 2012-10-30 DIAGNOSIS — R238 Other skin changes: Secondary | ICD-10-CM | POA: Insufficient documentation

## 2012-10-30 DIAGNOSIS — R931 Abnormal findings on diagnostic imaging of heart and coronary circulation: Secondary | ICD-10-CM | POA: Insufficient documentation

## 2012-10-30 DIAGNOSIS — J019 Acute sinusitis, unspecified: Secondary | ICD-10-CM

## 2012-10-30 MED ORDER — AMOXICILLIN-POT CLAVULANATE 875-125 MG PO TABS: 1.0000 | ORAL_TABLET | Freq: Two times a day (BID) | ORAL | Status: DC

## 2012-10-30 NOTE — Progress Notes (Signed)
Chief Complaint  Patient presents with  . Sinus Problem  . Facial Pain    Pt also has post nasal drip.  Sx onging for 4+ wks She has been treating with Advil Cold and Sinus..  . Headache  . Nasal Congestion    HPI: Patient comes in today for SDA for  new problem evaluation. Onset like a head cold with ear sx  That has continued and progressed with facial pain headache despite otc meds and time.  No fever sig cough cp sob.  cloggesd ears and antihistamine clogged and now ha face pain and teeth bruise feeling right more than left.  Does plane travel and  Gets dizzy with this sometime.  Check left trunk erythema  Non tender  Had breast reduction months before .  No trauma or other.  Has pink area  Left chest no sx  To see derm in January  ROS: See pertinent positives and negatives per HPI.  Past Medical History  Diagnosis Date  . Headache   . Migraine   . Hyperlipidemia   . Urinary incontinence     related to childbirth urge and stress [name] helps  . Hx of skin cancer, basal cell   . Hx of colonic polyps     5 year recall history of rectal bleeding  . History of syncope     with low bp orthostatic onset hs  . Anxiety   . GERD (gastroesophageal reflux disease)     pt hew symptoms, difficulty swallowing, lump in throat  . Internal hemorrhoids     Family History  Problem Relation Age of Onset  . Hyperlipidemia Mother   . Depression Father   . Hypertension Father   . Other Father     tranverse mylitis  . Depression Brother   . Hypertension Brother   . Depression Daughter   . Anxiety disorder Daughter   . Colon polyps Father   . Allergies      Children  . Colon polyps Paternal Grandmother     History   Social History  . Marital Status: Married    Spouse Name: N/A    Number of Children: N/A  . Years of Education: N/A   Social History Main Topics  . Smoking status: Never Smoker   . Smokeless tobacco: Never Used  . Alcohol Use: 4.2 oz/week    7 Glasses of wine per  week  . Drug Use: No  . Sexually Active: None   Other Topics Concern  . None   Social History Narrative   Occupation: Airline pilot, Masters level education, Travels 4 days per weekMarriedRegular exercise- yesHH of 4No petsRecently moved from [place] glass wine per night    Outpatient Encounter Prescriptions as of 10/30/2012  Medication Sig Dispense Refill  . ALPRAZolam (XANAX) 0.25 MG tablet Take 1 tablet (0.25 mg total) by mouth 3 (three) times daily as needed for sleep. For panic attack  30 tablet  0  . LEXAPRO 10 MG tablet Take 10 mg by mouth Daily. Takes 1/2 tablet daily      . Naproxen Sodium (ALEVE PO) Take by mouth as needed.      Marland Kitchen amoxicillin-clavulanate (AUGMENTIN) 875-125 MG per tablet Take 1 tablet by mouth every 12 (twelve) hours.  20 tablet  0    EXAM:  BP 110/64  Pulse 97  Temp 98.1 F (36.7 C) (Oral)  Wt 150 lb (68.04 kg)  SpO2 99%  There is no height on file to calculate BMI.  GENERAL: vitals reviewed and listed above, alert, oriented, appears well hydrated and in no acute distress  HEENT: atraumatic, conjunctiva  clear, no obvious abnormalities on inspection of external nose and ears  tms nl lm  Nares congested and right facial tenderness  OP : no lesion edema or exudate   NECK: no obvious masses on inspection palpation  No adenopathy   LUNGS: clear to auscultation bilaterally, no wheezes, rales or rhonchi, good air movement Skin left later ribcage with 6-7 cm faint dark pink coloration with dermal thickening and no pain scale or itch.   CV: HRRR, no clubbing cyanosis or  peripheral edema nl cap refill   MS: moves all extremities without noticeable focal  abnormality  PSYCH: pleasant and cooperative,   ASSESSMENT AND PLAN:  Discussed the following assessment and plan:  1. Acute sinusitis with symptoms greater than 10 days    right maxilla   2. Abnormal skin color    with thickening no s get derm to check     -Patient advised to return or  notify health care team  immediately if symptoms worsen or persist or new concerns arise.  Patient Instructions  Treatment for sinusitis.  Saline nose spray.    Decongestant can still help.  Before getting on plane.  Expect improvement in the next 3-5 days.   See derm about skin area opinion   Burna Mortimer K. Panosh M.D.

## 2012-10-30 NOTE — Patient Instructions (Addendum)
Treatment for sinusitis.  Saline nose spray.    Decongestant can still help.  Before getting on plane.  Expect improvement in the next 3-5 days.   See derm about skin area opinion

## 2012-11-28 LAB — HM PAP SMEAR: HM Pap smear: NORMAL

## 2012-11-28 LAB — HM MAMMOGRAPHY: HM Mammogram: NORMAL

## 2012-12-03 ENCOUNTER — Other Ambulatory Visit: Payer: Self-pay | Admitting: Obstetrics and Gynecology

## 2012-12-03 DIAGNOSIS — R928 Other abnormal and inconclusive findings on diagnostic imaging of breast: Secondary | ICD-10-CM

## 2012-12-11 ENCOUNTER — Ambulatory Visit
Admission: RE | Admit: 2012-12-11 | Discharge: 2012-12-11 | Disposition: A | Discharge status: Home / Self Care | Payer: Managed Care, Other (non HMO) | Source: Ambulatory Visit | Attending: Obstetrics and Gynecology | Admitting: Obstetrics and Gynecology

## 2012-12-11 DIAGNOSIS — R928 Other abnormal and inconclusive findings on diagnostic imaging of breast: Secondary | ICD-10-CM

## 2013-02-28 ENCOUNTER — Other Ambulatory Visit: Payer: Self-pay | Admitting: Family Medicine

## 2013-02-28 ENCOUNTER — Telehealth: Payer: Self-pay | Admitting: Internal Medicine

## 2013-02-28 MED ORDER — FLUTICASONE PROPIONATE 50 MCG/ACT NA SUSP: 2.0000 | Freq: Every day | NASAL | Status: DC

## 2013-02-28 MED ORDER — LEVOCETIRIZINE DIHYDROCHLORIDE 5 MG PO TABS: 5.0000 mg | ORAL_TABLET | Freq: Every evening | ORAL | Status: DC

## 2013-02-28 NOTE — Telephone Encounter (Signed)
Seen for sinus problems on 10/30/12.  I do not see these medications in her current med list.  Please advise.  Thanks!!

## 2013-02-28 NOTE — Telephone Encounter (Signed)
Patient notified that rx was sent to the pharmacy.  Instructed to make an appt if not helping.

## 2013-02-28 NOTE — Telephone Encounter (Signed)
Pt requesting rx for Xyzal and Flonase. States she's had this in the past, but it's been a couple of years. Allergies are bad this year. Please advise. Pt uses [facility] on [address]. Let her know if rx is ok, or if she needs OV.

## 2013-02-28 NOTE — Telephone Encounter (Signed)
Sent to the pharmacy by e-scribe. 

## 2013-02-28 NOTE — Telephone Encounter (Signed)
I am ok with this but insurance may not pay for xyzal   As this is a rx medication and often have to have had been on the other antihistamines first .     xyzal 5 mg disp 30 refill x 2  flonase  2 sprays each nares qd  Disp 1 refill x 6 .  OV if not helping .

## 2013-08-18 ENCOUNTER — Ambulatory Visit (INDEPENDENT_AMBULATORY_CARE_PROVIDER_SITE_OTHER): Payer: Managed Care, Other (non HMO) | Admitting: Family Medicine

## 2013-08-18 ENCOUNTER — Encounter: Payer: Self-pay | Admitting: Family Medicine

## 2013-08-18 VITALS — BP 102/70 | Temp 98.0°F | Wt 156.0 lb

## 2013-08-18 DIAGNOSIS — J029 Acute pharyngitis, unspecified: Secondary | ICD-10-CM

## 2013-08-18 DIAGNOSIS — J329 Chronic sinusitis, unspecified: Secondary | ICD-10-CM

## 2013-08-18 MED ORDER — AMOXICILLIN 875 MG PO TABS: 875.0000 mg | ORAL_TABLET | Freq: Two times a day (BID) | ORAL | Status: DC

## 2013-08-18 NOTE — Patient Instructions (Addendum)
INSTRUCTIONS FOR UPPER RESPIRATORY INFECTION:  -plenty of rest and fluids  -As we discussed, we have prescribed a new medication (AMOXICILLIN) for you at this appointment. We discussed the common and serious potential adverse effects of this medication and you can review these and more with the pharmacist when you pick up your medication.  Please follow the instructions for use carefully and notify us immediately if you have any problems taking this medication.  -nasal saline wash 2-3 times daily (use prepackaged nasal saline or bottled/distilled water if making your own)   -clean nose with nasal saline before using the nasal steroid or sinex  -can use sinex or afrin nasal spray for drainage and nasal congestion - but do NOT use longer then 3-4 days  -can use tylenol or ibuprofen as directed for aches and sorethroat  -if you are taking a cough medication - use only as directed, may also try a teaspoon of honey to coat the throat and throat lozenges  -for sore throat, salt water gargles can help  -follow up if you have fevers, facial pain, tooth pain, difficulty breathing or are worsening or not getting better in 5-7 days

## 2013-08-18 NOTE — Progress Notes (Signed)
Chief Complaint  Patient presents with  . Sore Throat    HPI:  Acute visit for sore throat: -cold for a few weeks -symptoms: sore throat, HA, sinus pain and pressure, nasal congestion, cough -over the counter treatments -Daughter with strep throat -denies: fevers, SOB, wheezing ROS: See pertinent positives and negatives per HPI.  Past Medical History  Diagnosis Date  . Headache(784.0)   . Migraine   . Hyperlipidemia   . Urinary incontinence     related to childbirth urge and stress [name] helps  . Hx of skin cancer, basal cell   . Hx of colonic polyps     5 year recall history of rectal bleeding  . History of syncope     with low bp orthostatic onset hs  . Anxiety   . GERD (gastroesophageal reflux disease)     pt hew symptoms, difficulty swallowing, lump in throat  . Internal hemorrhoids   . Agatston coronary artery calcium score less than 100 2013    Past Surgical History  Procedure Laterality Date  . Cesarean section    . Dilation and curettage of uterus      for miscarrage  . Destruction of lesion anal    . Reduction mammaplasty  2013    Family History  Problem Relation Age of Onset  . Hyperlipidemia Mother   . Depression Father   . Hypertension Father   . Other Father     tranverse mylitis  . Depression Brother   . Hypertension Brother   . Depression Daughter   . Anxiety disorder Daughter   . Colon polyps Father   . Allergies      Children  . Colon polyps Paternal Grandmother     History   Social History  . Marital Status: Married    Spouse Name: N/A    Number of Children: N/A  . Years of Education: N/A   Social History Main Topics  . Smoking status: Never Smoker   . Smokeless tobacco: Never Used  . Alcohol Use: 4.2 oz/week    7 Glasses of wine per week  . Drug Use: No  . Sexual Activity: None   Other Topics Concern  . 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 For panic attack, Disp: 30 tablet, Rfl: 0;  amoxicillin-clavulanate (AUGMENTIN) 875-125 MG per tablet, Take 1 tablet by mouth every 12 (twelve) hours., Disp: 20 tablet, Rfl: 0;  fluticasone (FLONASE) 50 MCG/ACT nasal spray, Place 2 sprays into the nose daily., Disp: 16 g, Rfl: 6 levocetirizine (XYZAL) 5 MG tablet, Take 1 tablet (5 mg total) by mouth every evening., Disp: 30 tablet, Rfl: 2;  LEXAPRO 10 MG tablet, Take 10 mg by mouth Daily. Takes 1/2 tablet daily, Disp: , Rfl: ;  Naproxen Sodium (ALEVE PO), Take by mouth as needed., Disp: , Rfl: ;  amoxicillin (AMOXIL) 875 MG tablet, Take 1 tablet (875 mg total) by mouth 2 (two) times daily., Disp: 20 tablet, Rfl: 0  EXAM:  Filed Vitals:   08/18/13 0911  BP: 102/70  Temp: 98 F (36.7 C)    Body  mass index is 24.43 kg/(m^2).  GENERAL: vitals reviewed and listed above, alert, oriented, appears well hydrated and in no acute distress  HEENT: atraumatic, conjunttiva clear, no obvious abnormalities on inspection of external nose and ears, normal appearance of ear canals and TMs, clear nasal congestion, mild post oropharyngeal erythema with PND, no tonsillar edema or exudate, no sinus TTP  NECK: no obvious masses on inspection  LUNGS: clear to auscultation bilaterally, no wheezes, rales or rhonchi, good air movement  CV: HRRR, no peripheral edema  MS: moves all extremities without noticeable abnormality  PSYCH: pleasant and cooperative, no obvious depression or anxiety  ASSESSMENT AND PLAN:  Discussed the following assessment and plan:  Sinusitis - Plan: amoxicillin (AMOXIL) 875 MG tablet  Sore throat  -Recommendations per orders and instructions, risks and use of  medications and return precautions discussed. -Patient advised to return or notify a doctor immediately if symptoms worsen or persist or new concerns arise.  Patient Instructions  INSTRUCTIONS FOR UPPER RESPIRATORY INFECTION:  -plenty of rest and fluids  -As we discussed, we have prescribed a new medication (AMOXICILLIN) for you at this appointment. We discussed the common and serious potential adverse effects of this medication and you can review these and more with the pharmacist when you pick up your medication.  Please follow the instructions for use carefully and notify us immediately if you have any problems taking this medication.  -nasal saline wash 2-3 times daily (use prepackaged nasal saline or bottled/distilled water if making your own)   -clean nose with nasal saline before using the nasal steroid or sinex  -can use sinex or afrin nasal spray for drainage and nasal congestion - but do NOT use longer then 3-4 days  -can use tylenol or ibuprofen as directed for aches and sorethroat  -if you are taking a cough medication - use only as directed, may also try a teaspoon of honey to coat the throat and throat lozenges  -for sore throat, salt water gargles can help  -follow up if you have fevers, facial pain, tooth pain, difficulty breathing or are worsening or not getting better in 5-7 days       KIM, HANNAH R.

## 2013-09-01 ENCOUNTER — Other Ambulatory Visit: Payer: Managed Care, Other (non HMO)

## 2013-09-02 ENCOUNTER — Other Ambulatory Visit: Payer: Managed Care, Other (non HMO)

## 2013-09-04 ENCOUNTER — Other Ambulatory Visit (INDEPENDENT_AMBULATORY_CARE_PROVIDER_SITE_OTHER): Payer: Managed Care, Other (non HMO)

## 2013-09-04 DIAGNOSIS — Z0000 Encounter for general adult medical examination without abnormal findings: Secondary | ICD-10-CM

## 2013-09-04 DIAGNOSIS — E785 Hyperlipidemia, unspecified: Secondary | ICD-10-CM

## 2013-09-04 LAB — BASIC METABOLIC PANEL
BUN: 12 mg/dL (ref 6–23)
CO2: 26 mEq/L (ref 19–32)
Calcium: 8.8 mg/dL (ref 8.4–10.5)
Chloride: 103 mEq/L (ref 96–112)
Creatinine, Ser: 0.8 mg/dL (ref 0.4–1.2)
GFR: 86.22 mL/min (ref >60.00)
Glucose, Bld: 88 mg/dL (ref 70–99)
Potassium: 4 mEq/L (ref 3.5–5.1)
Sodium: 139 mEq/L (ref 135–145)

## 2013-09-04 LAB — CBC WITH DIFFERENTIAL/PLATELET
Basophils Absolute: 0 10*3/uL (ref 0.0–0.1)
Basophils Relative: 0.6 % (ref 0.0–3.0)
Eosinophils Absolute: 0.1 10*3/uL (ref 0.0–0.7)
Eosinophils Relative: 2.1 % (ref 0.0–5.0)
HCT: 38.3 % (ref 36.0–46.0)
Hemoglobin: 13.1 g/dL (ref 12.0–15.0)
Lymphocytes Relative: 40.1 % (ref 12.0–46.0)
Lymphs Abs: 2.4 10*3/uL (ref 0.7–4.0)
MCHC: 34.1 g/dL (ref 30.0–36.0)
MCV: 90 fl (ref 78.0–100.0)
Monocytes Absolute: 0.3 10*3/uL (ref 0.1–1.0)
Monocytes Relative: 5.2 % (ref 3.0–12.0)
Neutro Abs: 3.1 10*3/uL (ref 1.4–7.7)
Neutrophils Relative %: 52 % (ref 43.0–77.0)
Platelets: 297 10*3/uL (ref 150.0–400.0)
RBC: 4.26 Mil/uL (ref 3.87–5.11)
RDW: 13.8 % (ref 11.5–14.6)
WBC: 5.9 10*3/uL (ref 4.5–10.5)

## 2013-09-04 LAB — HEPATIC FUNCTION PANEL
ALT: 23 U/L (ref 0–35)
AST: 38 U/L — ABNORMAL HIGH (ref 0–37)
Albumin: 4.2 g/dL (ref 3.5–5.2)
Alkaline Phosphatase: 54 U/L (ref 39–117)
Bilirubin, Direct: 0 mg/dL (ref 0.0–0.3)
Total Bilirubin: 0.4 mg/dL (ref 0.3–1.2)
Total Protein: 7.1 g/dL (ref 6.0–8.3)

## 2013-09-04 LAB — LDL CHOLESTEROL, DIRECT: Direct LDL: 139.3 mg/dL

## 2013-09-04 LAB — LIPID PANEL
Cholesterol: 210 mg/dL — ABNORMAL HIGH (ref 0–200)
HDL: 64.9 mg/dL (ref >39.00)
Total CHOL/HDL Ratio: 3
Triglycerides: 69 mg/dL (ref 0.0–149.0)
VLDL: 13.8 mg/dL (ref 0.0–40.0)

## 2013-09-04 LAB — TSH: TSH: 1.09 u[IU]/mL (ref 0.35–5.50)

## 2013-09-08 ENCOUNTER — Encounter: Payer: Self-pay | Admitting: Internal Medicine

## 2013-09-08 ENCOUNTER — Ambulatory Visit (INDEPENDENT_AMBULATORY_CARE_PROVIDER_SITE_OTHER): Payer: Managed Care, Other (non HMO) | Admitting: Internal Medicine

## 2013-09-08 VITALS — BP 106/74 | HR 72 | Temp 98.0°F | Ht 66.25 in | Wt 154.0 lb

## 2013-09-08 DIAGNOSIS — R945 Abnormal results of liver function studies: Secondary | ICD-10-CM

## 2013-09-08 DIAGNOSIS — Z23 Encounter for immunization: Secondary | ICD-10-CM

## 2013-09-08 DIAGNOSIS — F419 Anxiety disorder, unspecified: Secondary | ICD-10-CM

## 2013-09-08 DIAGNOSIS — Z0000 Encounter for general adult medical examination without abnormal findings: Secondary | ICD-10-CM

## 2013-09-08 MED ORDER — ALPRAZOLAM 0.25 MG PO TABS: 0.2500 mg | ORAL_TABLET | Freq: Three times a day (TID) | ORAL | Status: DC | PRN

## 2013-09-08 NOTE — Patient Instructions (Addendum)
  Repeat liver tests with serology in about a month ( no OV needed )( lfts, hep c aby, hep b ag ,hep b aby )  If ok then check yearly .   150 minutes of exercise weeks  ,  bmi below 30  To be at healthy levels. Avoid trans fats and processed foods;  Increase fresh fruits and veges to 5 servings per day. And avoid sweet beverages  Including tea and juice.Get enough sleep   Exercise for back as discussed  Disc with gyne if assoc with abd pain also  Fu with dr Juanda Chance colonoscopy.

## 2013-09-08 NOTE — Progress Notes (Signed)
Chief Complaint  Patient presents with  . Annual Exam    HPI: Patient comes in today for Preventive Health Care visit   Doing well  [place] ok gyne  No sig cramps.  Mid month spotting .  Needs refill xanax for prn air travel   2-3 x per year. Last refill a year ago  Taking 5 mg lexapro per day doing well.  Due for  Colon soon 7  Years recall No tobacco exercises recently  2 glasses of wine 4-5 x per week.  Some back pain sometimes radiating to front   No down legs or weakness or weight loss. ROS:  GEN/ HEENT: No fever, significant weight changes sweats headaches vision problems hearing changes, CV/ PULM; No chest pain shortness of breath cough, syncope,edema  change in exercise tolerance. GI /GU: No adominal pain, vomiting, change in bowel habits. No blood in the stool. No significant GU symptoms. SKIN/HEME: ,no acute skin rashes suspicious lesions or bleeding. No lymphadenopathy, nodules, masses.  NEURO/ PSYCH:  No neurologic signs such as weakness numbness. No depression anxiety. IMM/ Allergy: No unusual infections.  Allergy .   REST of 12 system review negative except as per HPI   Past Medical History  Diagnosis Date  . Headache(784.0)   . Migraine   . Hyperlipidemia   . Urinary incontinence     related to childbirth urge and stress [name] helps  . Hx of skin cancer, basal cell   . Hx of colonic polyps     5 year recall history of rectal bleeding  . History of syncope     with low bp orthostatic onset hs  . Anxiety   . GERD (gastroesophageal reflux disease)     pt hew symptoms, difficulty swallowing, lump in throat  . Internal hemorrhoids   . Agatston coronary artery calcium score less than 100 2013  . Gynecomastia 03/01/2012    Family History  Problem Relation Age of Onset  . Hyperlipidemia Mother   . Depression Father   . Hypertension Father   . Other Father     tranverse mylitis  . Depression Brother   . Hypertension Brother   . Depression Daughter   . Anxiety  disorder Daughter   . Colon polyps Father   . Allergies      Children  . Colon polyps Paternal Grandmother     History   Social History  . Marital Status: Married    Spouse Name: N/A    Number of Children: N/A  . Years of Education: N/A   Social History Main Topics  . Smoking status: Never Smoker   . Smokeless tobacco: Never Used  . Alcohol Use: 4.2 oz/week    7 Glasses of wine per week  . Drug Use: No  . Sexual Activity: None   Other Topics Concern  . None   Social History Narrative   Occupation: Airline pilot, Masters level education, Travels 4 days per week   Married   Regular exercise- yes   HH of 4   No pets   Recently moved from [place]   G4P2   1 glass wine per night    Outpatient Encounter Prescriptions as of 09/08/2013  Medication Sig Dispense Refill  . ALPRAZolam (XANAX) 0.25 MG tablet Take 1 tablet (0.25 mg total) by mouth 3 (three) times daily as needed for sleep. For panic attack  30 tablet  0  . LEXAPRO 10 MG tablet Take 10 mg by mouth Daily. Takes 1/2 tablet daily      .  TOVIAZ 4 MG TB24 tablet       . [DISCONTINUED] ALPRAZolam (XANAX) 0.25 MG tablet Take 1 tablet (0.25 mg total) by mouth 3 (three) times daily as needed for sleep. For panic attack  30 tablet  0  . [DISCONTINUED] amoxicillin (AMOXIL) 875 MG tablet Take 1 tablet (875 mg total) by mouth 2 (two) times daily.  20 tablet  0  . [DISCONTINUED] amoxicillin-clavulanate (AUGMENTIN) 875-125 MG per tablet Take 1 tablet by mouth every 12 (twelve) hours.  20 tablet  0  . [DISCONTINUED] fluticasone (FLONASE) 50 MCG/ACT nasal spray Place 2 sprays into the nose daily.  16 g  6  . [DISCONTINUED] levocetirizine (XYZAL) 5 MG tablet Take 1 tablet (5 mg total) by mouth every evening.  30 tablet  2  . [DISCONTINUED] Naproxen Sodium (ALEVE PO) Take by mouth as needed.       No facility-administered encounter medications on file as of 09/08/2013.    EXAM:  BP 106/74  Pulse 72  Temp(Src) 98 F (36.7 C)  (Oral)  Ht 5' 6.25" (1.683 m)  Wt 154 lb (69.854 kg)  BMI 24.66 kg/m2  SpO2 98%  Body mass index is 24.66 kg/(m^2).  Physical Exam: Vital signs reviewed ZOX:WRUE is a well-developed well-nourished alert cooperative   female who appears her stated age in no acute distress.  HEENT: normocephalic atraumatic , Eyes: PERRL EOM's full, conjunctiva clear, Nares: paten,t no deformity discharge or tenderness., Ears: no deformity EAC's clear TMs with normal landmarks. Mouth: clear OP, no lesions, edema.  Moist mucous membranes. Dentition in adequate repair. NECK: supple without masses, thyromegaly or bruits. CHEST/PULM:  Clear to auscultation and percussion breath sounds equal no wheeze , rales or rhonchi. No chest wall deformities or tenderness. CV: PMI is nondisplaced, S1 S2 no gallops, murmurs, rubs. Peripheral pulses are full without delay.No JVD .  ABDOMEN: Bowel sounds normal nontender  No guard or rebound, no hepato splenomegal no CVA tenderness.  No hernia. Extremtities:  No clubbing cyanosis or edema, no acute joint swelling or redness no focal atrophy NEURO:  Oriented x3, cranial nerves 3-12 appear to be intact, no obvious focal weakness,gait within normal limits no abnormal reflexes or asymmetrical SKIN: No acute rashes normal turgor, color, no bruising or petechiae. PSYCH: Oriented, good eye contact, no obvious depression anxiety, cognition and judgment appear normal. LN: no cervical axillary inguinal adenopathy  Lab Results  Component Value Date   WBC 5.9 09/04/2013   HGB 13.1 09/04/2013   HCT 38.3 09/04/2013   PLT 297.0 09/04/2013   GLUCOSE 88 09/04/2013   CHOL 210* 09/04/2013   TRIG 69.0 09/04/2013   HDL 64.90 09/04/2013   LDLDIRECT 139.3 09/04/2013   ALT 23 09/04/2013   AST 38* 09/04/2013   NA 139 09/04/2013   K 4.0 09/04/2013   CL 103 09/04/2013   CREATININE 0.8 09/04/2013   BUN 12 09/04/2013   CO2 26 09/04/2013   TSH 1.09 09/04/2013    ASSESSMENT AND PLAN:  Discussed the following  assessment and plan:  Encounter for preventive health examination  Need for prophylactic vaccination and inoculation against influenza - Plan: Flu Vaccine QUAD 36+ mos PF IM (Fluarix)  Need for prophylactic vaccination with combined diphtheria-tetanus-pertussis (DTP) vaccine - Plan: Tdap vaccine greater than or equal to 7yo IM  Anxiety - Plan: ALPRAZolam (XANAX) 0.25 MG tablet  Nonspecific abnormal results of liver function study  Patient Care Team: Madelin Headings, MD as PCP - General Hart Carwin, MD (Gastroenterology) Kenard Gower  Dione Plover, MD (Dermatology) kortesis (Plastic Surgery) Freddrick March. Tenny Craw, MD as Consulting Physician (Obstetrics and Gynecology) Patient Instructions   Repeat liver tests with serology in about a month ( no OV needed )( lfts, hep c aby, hep b ag ,hep b aby )  If ok then check yearly .   150 minutes of exercise weeks  ,  bmi below 30  To be at healthy levels. Avoid trans fats and processed foods;  Increase fresh fruits and veges to 5 servings per day. And avoid sweet beverages  Including tea and juice.Get enough sleep   Exercise for back as discussed  Disc with gyne if assoc with abd pain also  Fu with dr Juanda Chance colonoscopy.     Neta Mends. Frona Yost M.D.  Health Maintenance  Topic Date Due  . Influenza Vaccine  06/27/2014  . Pap Smear  11/29/2015  . Tetanus/tdap  09/09/2023   Health Maintenance Review

## 2013-09-26 ENCOUNTER — Encounter: Payer: Self-pay | Admitting: Internal Medicine

## 2013-10-09 ENCOUNTER — Other Ambulatory Visit: Payer: Managed Care, Other (non HMO)

## 2013-10-09 ENCOUNTER — Encounter: Payer: Self-pay | Admitting: Internal Medicine

## 2013-10-31 ENCOUNTER — Other Ambulatory Visit: Payer: Self-pay | Admitting: Obstetrics and Gynecology

## 2013-10-31 ENCOUNTER — Other Ambulatory Visit: Payer: Self-pay

## 2013-10-31 DIAGNOSIS — N6452 Nipple discharge: Secondary | ICD-10-CM

## 2013-11-14 ENCOUNTER — Ambulatory Visit (AMBULATORY_SURGERY_CENTER): Payer: Self-pay

## 2013-11-14 VITALS — Ht 67.0 in | Wt 152.0 lb

## 2013-11-14 DIAGNOSIS — Z860100 Personal history of colon polyps, unspecified: Secondary | ICD-10-CM

## 2013-11-14 MED ORDER — MOVIPREP 100 G PO SOLR: 1.0000 | Freq: Once | ORAL | Status: DC

## 2013-11-14 NOTE — Progress Notes (Signed)
Pt wants to discuss hemorrhoidal pain with MD before procedure.

## 2013-11-17 ENCOUNTER — Ambulatory Visit
Admission: RE | Admit: 2013-11-17 | Discharge: 2013-11-17 | Disposition: A | Discharge status: Home / Self Care | Payer: Managed Care, Other (non HMO) | Source: Ambulatory Visit | Attending: Obstetrics and Gynecology | Admitting: Obstetrics and Gynecology

## 2013-11-17 ENCOUNTER — Other Ambulatory Visit: Payer: Self-pay | Admitting: Obstetrics and Gynecology

## 2013-11-17 DIAGNOSIS — N6452 Nipple discharge: Secondary | ICD-10-CM

## 2013-11-24 ENCOUNTER — Encounter: Payer: Self-pay | Admitting: Internal Medicine

## 2013-12-05 ENCOUNTER — Ambulatory Visit (AMBULATORY_SURGERY_CENTER): Payer: Managed Care, Other (non HMO) | Admitting: Internal Medicine

## 2013-12-05 ENCOUNTER — Encounter: Payer: Self-pay | Admitting: Internal Medicine

## 2013-12-05 VITALS — BP 120/62 | HR 68 | Temp 98.2°F | Resp 19 | Ht 67.0 in | Wt 152.0 lb

## 2013-12-05 DIAGNOSIS — Z86010 Personal history of colonic polyps: Secondary | ICD-10-CM

## 2013-12-05 DIAGNOSIS — Z8371 Family history of colonic polyps: Secondary | ICD-10-CM

## 2013-12-05 NOTE — Op Note (Addendum)
[place]  [address]. [place], [address]   COLONOSCOPY PROCEDURE REPORT  PATIENT: Tamara Watkins, Tamara Watkins  MR#: [id number] BIRTHDATE: June 16, 1966 , 47  yrs. old GENDER: Female ENDOSCOPIST: Lafayette Dragon, MD REFERRED DJ:MEQAS Darnelle Going, M.D. PROCEDURE DATE:  12/05/2013 PROCEDURE:   Colonoscopy, screening First Screening Colonoscopy - Avg.  risk and is 50 yrs.  old or older - No.  Prior Negative Screening - Now for repeat screening. N/A  History of Adenoma - Now for follow-up colonoscopy & has been > or = to 3 yrs.  N/A  Polyps Removed Today? No.  Recommend repeat exam, <10 yrs? No. ASA CLASS:   Class I INDICATIONS:hyperplastic polyp found on colonoscopy in 2007 in [facility], [place]. F hx of colon polyps MEDICATIONS: MAC sedation, administered by CRNA and propofol (Diprivan) 300mg  IV  DESCRIPTION OF PROCEDURE:   After the risks benefits and alternatives of the procedure were thoroughly explained, informed consent was obtained.  A digital rectal exam revealed no abnormalities of the rectum.   The LB [id number]  endoscope was introduced through the anus and advanced to the cecum, which was identified by both the appendix and ileocecal valve. No adverse events experienced.   The quality of the prep was excellent, using MoviPrep  The instrument was then slowly withdrawn as the colon was fully examined.      COLON FINDINGS: A normal appearing cecum, ileocecal valve, and appendiceal orifice were identified.  The ascending, hepatic flexure, transverse, splenic flexure, descending, sigmoid colon and rectum appeared unremarkable.  No polyps or cancers were seen. Retroflexed views revealed no abnormalities. The time to cecum=4 minutes 03 seconds.  Withdrawal time=6 minutes 0 seconds.  The scope was withdrawn and the procedure completed. COMPLICATIONS: There were no complications.  ENDOSCOPIC IMPRESSION: Normal colon  RECOMMENDATIONS: high fiber diet Recall  colonoscopy in 10 years   eSigned:  Lafayette Dragon, MD 12/05/2013 1:16 PM Revised: 12/05/2013 1:16 PM  cc:

## 2013-12-05 NOTE — Progress Notes (Signed)
Procedure ends, to recovery, report given and VSS. 

## 2013-12-05 NOTE — Patient Instructions (Signed)
Discharge instructions given with verbal understanding. Normal exam. Resume previous medications. YOU HAD AN ENDOSCOPIC PROCEDURE TODAY AT THE [facility]: Refer to the procedure report that was given to you for any specific questions about what was found during the examination.  If the procedure report does not answer your questions, please call your gastroenterologist to clarify.  If you requested that your care partner not be given the details of your procedure findings, then the procedure report has been included in a sealed envelope for you to review at your convenience later.  YOU SHOULD EXPECT: Some feelings of bloating in the abdomen. Passage of more gas than usual.  Walking can help get rid of the air that was put into your GI tract during the procedure and reduce the bloating. If you had a lower endoscopy (such as a colonoscopy or flexible sigmoidoscopy) you may notice spotting of blood in your stool or on the toilet paper. If you underwent a bowel prep for your procedure, then you may not have a normal bowel movement for a few days.  DIET: Your first meal following the procedure should be a light meal and then it is ok to progress to your normal diet.  A half-sandwich or bowl of soup is an example of a good first meal.  Heavy or fried foods are harder to digest and may make you feel nauseous or bloated.  Likewise meals heavy in dairy and vegetables can cause extra gas to form and this can also increase the bloating.  Drink plenty of fluids but you should avoid alcoholic beverages for 24 hours.  ACTIVITY: Your care partner should take you home directly after the procedure.  You should plan to take it easy, moving slowly for the rest of the day.  You can resume normal activity the day after the procedure however you should NOT DRIVE or use heavy machinery for 24 hours (because of the sedation medicines used during the test).    SYMPTOMS TO REPORT IMMEDIATELY: A gastroenterologist  can be reached at any hour.  During normal business hours, 8:30 AM to 5:00 PM Monday through Friday, call [phone].  After hours and on weekends, please call the GI answering service at [phone] who will take a message and have the physician on call contact you.   Following lower endoscopy (colonoscopy or flexible sigmoidoscopy):  Excessive amounts of blood in the stool  Significant tenderness or worsening of abdominal pains  Swelling of the abdomen that is new, acute  Fever of 100F or higher  FOLLOW UP: If any biopsies were taken you will be contacted by phone or by letter within the next 1-3 weeks.  Call your gastroenterologist if you have not heard about the biopsies in 3 weeks.  Our staff will call the home number listed on your records the next business day following your procedure to check on you and address any questions or concerns that you may have at that time regarding the information given to you following your procedure. This is a courtesy call and so if there is no answer at the home number and we have not heard from you through the emergency physician on call, we will assume that you have returned to your regular daily activities without incident.  SIGNATURES/CONFIDENTIALITY: You and/or your care partner have signed paperwork which will be entered into your electronic medical record.  These signatures attest to the fact that that the information above on your After Visit Summary has been reviewed   and is understood.  Full responsibility of the confidentiality of this discharge information lies with you and/or your care-partner. 

## 2013-12-08 ENCOUNTER — Telehealth: Payer: Self-pay

## 2013-12-08 NOTE — Telephone Encounter (Signed)
  Follow up Call-  Call back number 12/05/2013  Post procedure Call Back phone  # [phone]  Permission to leave phone message Yes     Patient questions:  Do you have a fever, pain , or abdominal swelling? no Pain Score  0 *  Have you tolerated food without any problems? yes  Have you been able to return to your normal activities? yes  Do you have any questions about your discharge instructions: Diet   no Medications  no Follow up visit  no  Do you have questions or concerns about your Care? no  Actions: * If pain score is 4 or above: No action needed, pain <4.

## 2014-01-26 ENCOUNTER — Encounter: Payer: Self-pay | Admitting: Internal Medicine

## 2014-01-26 ENCOUNTER — Ambulatory Visit (INDEPENDENT_AMBULATORY_CARE_PROVIDER_SITE_OTHER): Payer: Managed Care, Other (non HMO) | Admitting: Internal Medicine

## 2014-01-26 VITALS — BP 110/76 | HR 89 | Temp 97.9°F | Ht 66.25 in | Wt 158.0 lb

## 2014-01-26 DIAGNOSIS — J019 Acute sinusitis, unspecified: Secondary | ICD-10-CM

## 2014-01-26 MED ORDER — AMOXICILLIN-POT CLAVULANATE 875-125 MG PO TABS: 1.0000 | ORAL_TABLET | Freq: Two times a day (BID) | ORAL | Status: DC

## 2014-01-26 NOTE — Progress Notes (Signed)
Chief Complaint  Patient presents with  . Nasal Congestion    Nasal Congestion started before Christmas.  Nasal discharge is bloody or brown.  Has tried OTC Advil Cold and Sinus with no relief.  . Headache  . Sinus Pressure  . Post Nasal Drip    HPI: Patient comes in today for SDA for  new problem evaluation. hsa been sick almost since z mas like a cold and then on lots of air flights  Continuing congestion without fever  This past week tight face and facial pain and incrising drainage of many colors. Has been using and Nettie pot saline Afrin sparingly. ROS: See pertinent positives and negatives per HPI. No fever chest pain syncope vision change neurologic signs related to above  Past Medical History  Diagnosis Date  . Headache(784.0)   . Migraine   . Hyperlipidemia   . Urinary incontinence     related to childbirth urge and stress [name] helps  . Hx of skin cancer, basal cell   . Hx of colonic polyps     5 year recall history of rectal bleeding  . History of syncope     with low bp orthostatic onset hs  . Anxiety   . GERD (gastroesophageal reflux disease)     pt hew symptoms, difficulty swallowing, lump in throat  . Internal hemorrhoids   . Agatston coronary artery calcium score less than 100 2013  . Gynecomastia 03/01/2012  . Environmental allergies     Family History  Problem Relation Age of Onset  . Hyperlipidemia Mother   . Depression Father   . Hypertension Father   . Other Father     tranverse mylitis  . Colon polyps Father   . Depression Brother   . Hypertension Brother   . Depression Daughter   . Anxiety disorder Daughter   . Allergies      Children  . Colon polyps Paternal Grandmother   . Colon cancer Neg Hx     History   Social History  . Marital Status: Married    Spouse Name: N/A    Number of Children: N/A  . Years of Education: N/A   Social History Main Topics  . Smoking status: Never Smoker   . Smokeless tobacco: Never Used  . Alcohol  Use: 4.2 oz/week    7 Glasses of wine per week  . Drug Use: No  . Sexual Activity: None   Other Topics Concern  . None   Social History Narrative   Occupation: Programmer, applications, Masters level education, Travels 4 days per week   Married   Regular exercise- yes   HH of 4   No pets   Recently moved from [place]   1 glass wine per night    Outpatient Encounter Prescriptions as of 01/26/2014  Medication Sig  . ALPRAZolam (XANAX) 0.25 MG tablet Take 1 tablet (0.25 mg total) by mouth 3 (three) times daily as needed for sleep. For panic attack  . fluticasone (FLONASE) 50 MCG/ACT nasal spray Place into both nostrils daily as needed for allergies or rhinitis.  Marland Kitchen LEXAPRO 10 MG tablet Take 10 mg by mouth Daily. Takes 1/2 tablet daily  . TOVIAZ 4 MG TB24 tablet   . amoxicillin-clavulanate (AUGMENTIN) 875-125 MG per tablet Take 1 tablet by mouth every 12 (twelve) hours.  Marland Kitchen levocetirizine (XYZAL) 5 MG tablet   . [DISCONTINUED] PRESCRIPTION MEDICATION Xyzol for allergies    EXAM:  BP 110/76  Pulse 89  Temp(Src) 97.9 F (36.6 C) (Oral)  Ht 5' 6.25" (1.683 m)  Wt 158 lb (71.668 kg)  BMI 25.30 kg/m2  SpO2 98%  Body mass index is 25.3 kg/(m^2). WDWN in NAD  quiet respirations; mildly congested  somewhat hoarse. Non toxic . HEENT: Normocephalic ;atraumatic , Eyes;  PERRL, EOMs  Full, lids and conjunctiva clear,,Ears: no deformities, canals nl, TM landmarks normal, Nose: no deformity 3+nasal congested;face maxillary bilateral  tender Mouth : OP clear without lesion or edema . Neck: Supple without adenopathy or masses or bruits Chest:  Clear to A&P without wheezes rales or rhonchi CV:  S1-S2 no gallops or murmurs peripheral perfusion is normal Skin :nl perfusion and no acute rashes   PSYCH: pleasant and cooperative, no obvious depression or anxiety  ASSESSMENT AND PLAN:  Discussed the following assessment and plan:  Acute sinusitis with symptoms greater than 10 days Hx of same   Seems never totally better from last time consider pred 5 days if  persistent or progressive if no fever etc.  -Patient advised to return or notify health care team  if symptoms worsen or persist or new concerns arise.  Patient Instructions  Continue local measures  salin etc  Add augmentin  Expect improvement in about 3-5 days  Contact us if  Not getting better sometimes adding prednisone helps also    Mariann Laster K. Panosh M.D.  Pre visit review using our clinic review tool, if applicable. No additional management support is needed unless otherwise documented below in the visit note.

## 2014-01-26 NOTE — Patient Instructions (Signed)
Continue local measures  salin etc  Add augmentin  Expect improvement in about 3-5 days  Contact us if  Not getting better sometimes adding prednisone helps also

## 2014-03-26 ENCOUNTER — Telehealth: Payer: Self-pay | Admitting: Internal Medicine

## 2014-03-26 DIAGNOSIS — F419 Anxiety disorder, unspecified: Secondary | ICD-10-CM

## 2014-03-26 MED ORDER — ALPRAZOLAM 0.25 MG PO TABS: 0.2500 mg | ORAL_TABLET | Freq: Three times a day (TID) | ORAL | Status: DC | PRN

## 2014-03-26 NOTE — Telephone Encounter (Signed)
[id number] - [place], [place] - [address] is requesting re-fill on ALPRAZolam (XANAX) 0.25 MG tablet

## 2014-03-26 NOTE — Telephone Encounter (Signed)
Ok x 1

## 2014-03-26 NOTE — Telephone Encounter (Signed)
Called to the pharmacy and left on voicemail. 

## 2014-04-27 ENCOUNTER — Ambulatory Visit (INDEPENDENT_AMBULATORY_CARE_PROVIDER_SITE_OTHER): Payer: Managed Care, Other (non HMO) | Admitting: Internal Medicine

## 2014-04-27 ENCOUNTER — Encounter: Payer: Self-pay | Admitting: Internal Medicine

## 2014-04-27 VITALS — BP 100/62 | HR 74 | Temp 98.8°F | Wt 157.0 lb

## 2014-04-27 DIAGNOSIS — L309 Dermatitis, unspecified: Secondary | ICD-10-CM

## 2014-04-27 DIAGNOSIS — L259 Unspecified contact dermatitis, unspecified cause: Secondary | ICD-10-CM

## 2014-04-27 MED ORDER — DOXYCYCLINE HYCLATE 100 MG PO CAPS: 100.0000 mg | ORAL_CAPSULE | Freq: Two times a day (BID) | ORAL | Status: DC

## 2014-04-27 NOTE — Patient Instructions (Addendum)
Trial of antibiotic  For acts like perioral dermatitis or such.   Minimize local irritants  Contact us prn if not getting better

## 2014-04-27 NOTE — Progress Notes (Signed)
Chief Complaint  Patient presents with  . Rash    On the left side of her nose.    HPI: Patient comes in today for SDA for  new problem evaluation. Days of left paranasal irritation rash  Hx of same near mouth check in past  rx w doxy this is in a different  .rash uses topical antibiotic  No fever burning sever itching new topicals   ROS: See pertinent positives and negatives per HPI.no systemic sx  Other associated    Past Medical History  Diagnosis Date  . Headache(784.0)   . Migraine   . Hyperlipidemia   . Urinary incontinence     related to childbirth urge and stress [name] helps  . Hx of skin cancer, basal cell   . Hx of colonic polyps     5 year recall history of rectal bleeding  . History of syncope     with low bp orthostatic onset hs  . Anxiety   . GERD (gastroesophageal reflux disease)     pt hew symptoms, difficulty swallowing, lump in throat  . Internal hemorrhoids   . Agatston coronary artery calcium score less than 100 2013  . Gynecomastia 03/01/2012  . Environmental allergies     Family History  Problem Relation Age of Onset  . Hyperlipidemia Mother   . Depression Father   . Hypertension Father   . Other Father     tranverse mylitis  . Colon polyps Father   . Depression Brother   . Hypertension Brother   . Depression Daughter   . Anxiety disorder Daughter   . Allergies      Children  . Colon polyps Paternal Grandmother   . Colon cancer Neg Hx     History   Social History  . Marital Status: Married    Spouse Name: N/A    Number of Children: N/A  . Years of Education: N/A   Social History Main Topics  . Smoking status: Never Smoker   . Smokeless tobacco: Never Used  . Alcohol Use: 4.2 oz/week    7 Glasses of wine per week  . Drug Use: No  . Sexual Activity: None   Other Topics Concern  . None   Social History Narrative   Occupation: Programmer, applications, Masters level education, Travels 4 days per week   Married   Regular exercise-  yes   HH of 4   No pets   Recently moved from [place]   1 glass wine per night    Outpatient Encounter Prescriptions as of 04/27/2014  Medication Sig  . ALPRAZolam (XANAX) 0.25 MG tablet Take 1 tablet (0.25 mg total) by mouth 3 (three) times daily as needed for sleep. For panic attack  . levocetirizine (XYZAL) 5 MG tablet Take 5 mg by mouth daily as needed.   Marland Kitchen LEXAPRO 10 MG tablet Daily. Takes 1/2 to a whole tablet daily  . TOVIAZ 4 MG TB24 tablet Take 4 mg by mouth daily.   Marland Kitchen doxycycline (VIBRAMYCIN) 100 MG capsule Take 1 capsule (100 mg total) by mouth 2 (two) times daily.  . [DISCONTINUED] amoxicillin-clavulanate (AUGMENTIN) 875-125 MG per tablet Take 1 tablet by mouth every 12 (twelve) hours.  . [DISCONTINUED] fluticasone (FLONASE) 50 MCG/ACT nasal spray Place into both nostrils daily as needed for allergies or rhinitis.    EXAM:  BP 100/62  Pulse 74  Temp(Src) 98.8 F (37.1 C) (Oral)  Wt 157 lb (71.215 kg)  SpO2 98%  Body mass index is 25.14 kg/(m^2). GENERAL: vitals reviewed and listed above, alert, oriented, appears well hydrated and in no acute distress HEENT: atraumatic, conjunctiva  clear, no obvious abnormalities on inspection of external nose and ears  Except for rash  SKIN: ;left paranasal redness  Blotchy rare papule no vesicle or crusting   No blisters nares patent no edemaNo adenopathy.   Neck  PSYCH: pleasant and cooperative, no obvious depression or anxiety ASSESSMENT AND PLAN:  Discussed the following assessment and plan:  Dermatitis of face - left paranasal  rash hx of perioral reponding to doxy. Diff dx: a;so SD but assymmetrical  -Patient advised to return or notify health care team  if symptoms worsen ,persist or new concerns arise.  Patient Instructions  Trial of antibiotic  For acts like perioral dermatitis or such.   Minimize local irritants  Contact us prn if not getting better   Mariann Laster K. Meziah Blasingame M.D.  Pre visit review using our clinic  review tool, if applicable. No additional management support is needed unless otherwise documented below in the visit note.

## 2014-05-10 ENCOUNTER — Other Ambulatory Visit: Payer: Self-pay | Admitting: Internal Medicine

## 2014-05-11 NOTE — Telephone Encounter (Signed)
Sent to the pharmacy by e-scribe. 

## 2014-06-08 ENCOUNTER — Ambulatory Visit (INDEPENDENT_AMBULATORY_CARE_PROVIDER_SITE_OTHER): Payer: Managed Care, Other (non HMO) | Admitting: Internal Medicine

## 2014-06-08 ENCOUNTER — Encounter: Payer: Self-pay | Admitting: Internal Medicine

## 2014-06-08 VITALS — BP 110/76 | Temp 98.7°F | Ht 66.25 in | Wt 158.0 lb

## 2014-06-08 DIAGNOSIS — R635 Abnormal weight gain: Secondary | ICD-10-CM

## 2014-06-08 DIAGNOSIS — R490 Dysphonia: Secondary | ICD-10-CM | POA: Insufficient documentation

## 2014-06-08 DIAGNOSIS — N926 Irregular menstruation, unspecified: Secondary | ICD-10-CM | POA: Insufficient documentation

## 2014-06-08 DIAGNOSIS — E049 Nontoxic goiter, unspecified: Secondary | ICD-10-CM

## 2014-06-08 DIAGNOSIS — J029 Acute pharyngitis, unspecified: Secondary | ICD-10-CM | POA: Insufficient documentation

## 2014-06-08 DIAGNOSIS — G479 Sleep disorder, unspecified: Secondary | ICD-10-CM

## 2014-06-08 LAB — CBC WITH DIFFERENTIAL/PLATELET
Basophils Absolute: 0 10*3/uL (ref 0.0–0.1)
Basophils Relative: 0 % (ref 0.0–3.0)
Eosinophils Absolute: 0.1 10*3/uL (ref 0.0–0.7)
Eosinophils Relative: 1.7 % (ref 0.0–5.0)
HCT: 38.6 % (ref 36.0–46.0)
Hemoglobin: 12.7 g/dL (ref 12.0–15.0)
Lymphocytes Relative: 28.7 % (ref 12.0–46.0)
Lymphs Abs: 1.1 10*3/uL (ref 0.7–4.0)
MCHC: 33 g/dL (ref 30.0–36.0)
MCV: 91.7 fl (ref 78.0–100.0)
Monocytes Absolute: 0.3 10*3/uL (ref 0.1–1.0)
Monocytes Relative: 9.1 % (ref 3.0–12.0)
Neutro Abs: 2.3 10*3/uL (ref 1.4–7.7)
Neutrophils Relative %: 60.5 % (ref 43.0–77.0)
Platelets: 302 10*3/uL (ref 150.0–400.0)
RBC: 4.21 Mil/uL (ref 3.87–5.11)
RDW: 14.3 % (ref 11.5–15.5)
WBC: 3.8 10*3/uL — ABNORMAL LOW (ref 4.0–10.5)

## 2014-06-08 LAB — POCT RAPID STREP A (OFFICE): Rapid Strep A Screen: NEGATIVE

## 2014-06-08 NOTE — Progress Notes (Signed)
Pre visit review using our clinic review tool, if applicable. No additional management support is needed unless otherwise documented below in the visit note.  Chief Complaint  Patient presents with  . Hoarse  . Weight Gain  . Insomnia  . Menstrual Problem    HPI: Patient comes in today for SDA for   problem evaluation.  Made appt before got new illness intervening.  Hoarse  For 8 months and weight  gain constantly now has an illnessover the last 3-4 days   Congestion  Hot and cold.achy feverish   Now  Had fever and  Sore throat .  Daughter had st . Remote hx of strep.   Dental work  Being seen for aggressive brushing   ? brush abrasions  ROS: See pertinent positives and negatives per HPI. constant congestion  nmpo snoring  Lots of congestion doesn't go away.   Allergy sx flonase xyzal no change . fels like drainbing constantly . For over a year.  Periods irreg recently hot at night     a year ago had ent check  With swallowing problem and had normal exam   lexapro ;rarely use of never xanax. But feels more comfortable has an active prescription her last prescription expired without her picking it up.  10 pounds weight gain  In 6 months  Some  Stress at work  Insomnia at night  Tired in day. sometimes up at night getting some work done   Past Medical History  Diagnosis Date  . Headache(784.0)   . Migraine   . Hyperlipidemia   . Urinary incontinence     related to childbirth urge and stress [name] helps  . Hx of skin cancer, basal cell   . Hx of colonic polyps     5 year recall history of rectal bleeding  . History of syncope     with low bp orthostatic onset hs  . Anxiety   . GERD (gastroesophageal reflux disease)     pt hew symptoms, difficulty swallowing, lump in throat  . Internal hemorrhoids   . Agatston coronary artery calcium score less than 100 2013  . Gynecomastia 03/01/2012  . Environmental allergies     Family History  Problem Relation Age of Onset  .  Hyperlipidemia Mother   . Depression Father   . Hypertension Father   . Other Father     tranverse mylitis  . Colon polyps Father   . Depression Brother   . Hypertension Brother   . Depression Daughter   . Anxiety disorder Daughter   . Allergies      Children  . Colon polyps Paternal Grandmother   . Colon cancer Neg Hx     History   Social History  . Marital Status: Married    Spouse Name: N/A    Number of Children: N/A  . Years of Education: N/A   Social History Main Topics  . Smoking status: Never Smoker   . Smokeless tobacco: Never Used  . Alcohol Use: 4.2 oz/week    7 Glasses of wine per week  . Drug Use: No  . Sexual Activity: None   Other Topics Concern  . None   Social History Narrative   Occupation: Programmer, applications, Masters level education, Travels 4 days per week   Married   Regular exercise- yes   HH of 4   No pets   Recently moved from [place]   1 glass wine per night    Outpatient  Encounter Prescriptions as of 06/08/2014  Medication Sig  . ALPRAZolam (XANAX) 0.25 MG tablet Take 1 tablet (0.25 mg total) by mouth 3 (three) times daily as needed for sleep. For panic attack  . levocetirizine (XYZAL) 5 MG tablet TAKE ONE TABLET BY MOUTH ONCE DAILY IN THE P.M.   . LEXAPRO 10 MG tablet Daily. Takes 1/2 to a whole tablet daily  . TOVIAZ 4 MG TB24 tablet Take 4 mg by mouth daily.   . [DISCONTINUED] doxycycline (VIBRAMYCIN) 100 MG capsule Take 1 capsule (100 mg total) by mouth 2 (two) times daily.  . [DISCONTINUED] levocetirizine (XYZAL) 5 MG tablet Take 5 mg by mouth daily as needed.     EXAM:  BP 110/76  Temp(Src) 98.7 F (37.1 C) (Oral)  Ht 5' 6.25" (1.683 m)  Wt 158 lb (71.668 kg)  BMI 25.30 kg/m2  Body mass index is 25.3 kg/(m^2).  GENERAL: vitals reviewed and listed above, alert, oriented, appears well hydrated and in no acute distress hoarse  No stridor  HEENT: atraumatic, conjunctiva  clear, no obvious abnormalities on inspection of  external nose and earstms clear  OP : no lesion edema or exudate 1+ red  NECK: no obvious masses on inspection easily palpated thryoid gland no nodules?shioddy ac nodes  LUNGS: clear to auscultation bilaterally, no wheezes, rales or rhonchi, good air movement CV: HRRR, no clubbing cyanosis or  peripheral edema nl cap refill  Abdomen:  Sof,t normal bowel sounds without hepatosplenomegaly, no guarding rebound or masses no CVA tenderness Skin: normal capillary refill ,turgor , color: No acute rashes ,petechiae or bruising MS: moves all extremities without noticeable focal  abnormality PSYCH: pleasant and cooperative, no obvious depression or anxiety Lab Results  Component Value Date   WBC 5.9 09/04/2013   HGB 13.1 09/04/2013   HCT 38.3 09/04/2013   PLT 297.0 09/04/2013   GLUCOSE 88 09/04/2013   CHOL 210* 09/04/2013   TRIG 69.0 09/04/2013   HDL 64.90 09/04/2013   LDLDIRECT 139.3 09/04/2013   ALT 23 09/04/2013   AST 38* 09/04/2013   NA 139 09/04/2013   K 4.0 09/04/2013   CL 103 09/04/2013   CREATININE 0.8 09/04/2013   BUN 12 09/04/2013   CO2 26 09/04/2013   TSH 1.09 09/04/2013    ASSESSMENT AND PLAN:  Discussed the following assessment and plan:  Hoarseness - x 8 months consder  reflux other pnd syndrome> ent check after illness resolved  - Plan: Basic metabolic panel, CBC with Differential, Hepatic function panel, TSH, T4, free, Thyroid antibodies  Sore throat - interim illness check for strep follw sx rx  - Plan: POCT rapid strep A, Basic metabolic panel, CBC with Differential, Hepatic function panel, TSH, T4, free, Thyroid antibodies, Culture, Group A Strep, CANCELED: Throat culture  Weight gain - poss multifactorial check  tsh etc  - Plan: Basic metabolic panel, CBC with Differential, Hepatic function panel, TSH, T4, free, Thyroid antibodies  Irregular menstrual cycle - Plan: Basic metabolic panel, CBC with Differential, Hepatic function panel, TSH, T4, free, Thyroid antibodies  Goiter - Plan:  Basic metabolic panel, CBC with Differential, Hepatic function panel, TSH, T4, free, Thyroid antibodies  Sleep disturbance - counseled  -Patient advised to return or notify health care team  if symptoms worsen ,persist or new concerns arise.  Patient Instructions  Hoarseness can be from reflux drainage  Vocal chord issues.  Check strep test today .  When better from acute illness plan another ent check of the vocal chords to  see if could be from  Silent reflux . Will notify you  of labs when available. Perimenopause can cause sleep  Issues  Poor sleep can be associated with weight gain . Plan fu in 1-2 months depending on labs  consdie trial of acid blocker   Insomnia Insomnia is frequent trouble falling and/or staying asleep. Insomnia can be a long term problem or a short term problem. Both are common. Insomnia can be a short term problem when the wakefulness is related to a certain stress or worry. Long term insomnia is often related to ongoing stress during waking hours and/or poor sleeping habits. Overtime, sleep deprivation itself can make the problem worse. Every little thing feels more severe because you are overtired and your ability to cope is decreased. CAUSES   Stress, anxiety, and depression.  Poor sleeping habits.  Distractions such as TV in the bedroom.  Naps close to bedtime.  Engaging in emotionally charged conversations before bed.  Technical reading before sleep.  Alcohol and other sedatives. They may make the problem worse. They can hurt normal sleep patterns and normal dream activity.  Stimulants such as caffeine for several hours prior to bedtime.  Pain syndromes and shortness of breath can cause insomnia.  Exercise late at night.  Changing time zones may cause sleeping problems (jet lag). It is sometimes helpful to have someone observe your sleeping patterns. They should look for periods of not breathing during the night (sleep apnea). They should also  look to see how long those periods last. If you live alone or observers are uncertain, you can also be observed at a sleep clinic where your sleep patterns will be professionally monitored. Sleep apnea requires a checkup and treatment. Give your caregivers your medical history. Give your caregivers observations your family has made about your sleep.  SYMPTOMS   Not feeling rested in the morning.  Anxiety and restlessness at bedtime.  Difficulty falling and staying asleep. TREATMENT   Your caregiver may prescribe treatment for an underlying medical disorders. Your caregiver can give advice or help if you are using alcohol or other drugs for self-medication. Treatment of underlying problems will usually eliminate insomnia problems.  Medications can be prescribed for short time use. They are generally not recommended for lengthy use.  Over-the-counter sleep medicines are not recommended for lengthy use. They can be habit forming.  You can promote easier sleeping by making lifestyle changes such as:  Using relaxation techniques that help with breathing and reduce muscle tension.  Exercising earlier in the day.  Changing your diet and the time of your last meal. No night time snacks.  Establish a regular time to go to bed.  Counseling can help with stressful problems and worry.  Soothing music and white noise may be helpful if there are background noises you cannot remove.  Stop tedious detailed work at least one hour before bedtime. HOME CARE INSTRUCTIONS   Keep a diary. Inform your caregiver about your progress. This includes any medication side effects. See your caregiver regularly. Take note of:  Times when you are asleep.  Times when you are awake during the night.  The quality of your sleep.  How you feel the next day. This information will help your caregiver care for you.  Get out of bed if you are still awake after 15 minutes. Read or do some quiet activity. Keep the  lights down. Wait until you feel sleepy and go back to bed.  Keep regular sleeping and waking  hours. Avoid naps.  Exercise regularly.  Avoid distractions at bedtime. Distractions include watching television or engaging in any intense or detailed activity like attempting to balance the household checkbook.  Develop a bedtime ritual. Keep a familiar routine of bathing, brushing your teeth, climbing into bed at the same time each night, listening to soothing music. Routines increase the success of falling to sleep faster.  Use relaxation techniques. This can be using breathing and muscle tension release routines. It can also include visualizing peaceful scenes. You can also help control troubling or intruding thoughts by keeping your mind occupied with boring or repetitive thoughts like the old concept of counting sheep. You can make it more creative like imagining planting one beautiful flower after another in your backyard garden.  During your day, work to eliminate stress. When this is not possible use some of the previous suggestions to help reduce the anxiety that accompanies stressful situations. MAKE SURE YOU:   Understand these instructions.  Will watch your condition.  Will get help right away if you are not doing well or get worse. Document Released: 11/10/2000 Document Revised: 02/05/2012 Document Reviewed: 12/11/2007 [facility] Patient Information 2015 [place], [place]. This information is not intended to replace advice given to you by your health care provider. Make sure you discuss any questions you have with your health care provider.      Standley Brooking. Panosh M.D.

## 2014-06-08 NOTE — Patient Instructions (Signed)
Hoarseness can be from reflux drainage  Vocal chord issues.  Check strep test today .  When better from acute illness plan another ent check of the vocal chords to see if could be from  Silent reflux . Will notify you  of labs when available. Perimenopause can cause sleep  Issues  Poor sleep can be associated with weight gain . Plan fu in 1-2 months depending on labs  consdie trial of acid blocker   Insomnia Insomnia is frequent trouble falling and/or staying asleep. Insomnia can be a long term problem or a short term problem. Both are common. Insomnia can be a short term problem when the wakefulness is related to a certain stress or worry. Long term insomnia is often related to ongoing stress during waking hours and/or poor sleeping habits. Overtime, sleep deprivation itself can make the problem worse. Every little thing feels more severe because you are overtired and your ability to cope is decreased. CAUSES   Stress, anxiety, and depression.  Poor sleeping habits.  Distractions such as TV in the bedroom.  Naps close to bedtime.  Engaging in emotionally charged conversations before bed.  Technical reading before sleep.  Alcohol and other sedatives. They may make the problem worse. They can hurt normal sleep patterns and normal dream activity.  Stimulants such as caffeine for several hours prior to bedtime.  Pain syndromes and shortness of breath can cause insomnia.  Exercise late at night.  Changing time zones may cause sleeping problems (jet lag). It is sometimes helpful to have someone observe your sleeping patterns. They should look for periods of not breathing during the night (sleep apnea). They should also look to see how long those periods last. If you live alone or observers are uncertain, you can also be observed at a sleep clinic where your sleep patterns will be professionally monitored. Sleep apnea requires a checkup and treatment. Give your caregivers your medical  history. Give your caregivers observations your family has made about your sleep.  SYMPTOMS   Not feeling rested in the morning.  Anxiety and restlessness at bedtime.  Difficulty falling and staying asleep. TREATMENT   Your caregiver may prescribe treatment for an underlying medical disorders. Your caregiver can give advice or help if you are using alcohol or other drugs for self-medication. Treatment of underlying problems will usually eliminate insomnia problems.  Medications can be prescribed for short time use. They are generally not recommended for lengthy use.  Over-the-counter sleep medicines are not recommended for lengthy use. They can be habit forming.  You can promote easier sleeping by making lifestyle changes such as:  Using relaxation techniques that help with breathing and reduce muscle tension.  Exercising earlier in the day.  Changing your diet and the time of your last meal. No night time snacks.  Establish a regular time to go to bed.  Counseling can help with stressful problems and worry.  Soothing music and white noise may be helpful if there are background noises you cannot remove.  Stop tedious detailed work at least one hour before bedtime. HOME CARE INSTRUCTIONS   Keep a diary. Inform your caregiver about your progress. This includes any medication side effects. See your caregiver regularly. Take note of:  Times when you are asleep.  Times when you are awake during the night.  The quality of your sleep.  How you feel the next day. This information will help your caregiver care for you.  Get out of bed if you are still  awake after 15 minutes. Read or do some quiet activity. Keep the lights down. Wait until you feel sleepy and go back to bed.  Keep regular sleeping and waking hours. Avoid naps.  Exercise regularly.  Avoid distractions at bedtime. Distractions include watching television or engaging in any intense or detailed activity like  attempting to balance the household checkbook.  Develop a bedtime ritual. Keep a familiar routine of bathing, brushing your teeth, climbing into bed at the same time each night, listening to soothing music. Routines increase the success of falling to sleep faster.  Use relaxation techniques. This can be using breathing and muscle tension release routines. It can also include visualizing peaceful scenes. You can also help control troubling or intruding thoughts by keeping your mind occupied with boring or repetitive thoughts like the old concept of counting sheep. You can make it more creative like imagining planting one beautiful flower after another in your backyard garden.  During your day, work to eliminate stress. When this is not possible use some of the previous suggestions to help reduce the anxiety that accompanies stressful situations. MAKE SURE YOU:   Understand these instructions.  Will watch your condition.  Will get help right away if you are not doing well or get worse. Document Released: 11/10/2000 Document Revised: 02/05/2012 Document Reviewed: 12/11/2007 [facility] Patient Information 2015 [place], [place]. This information is not intended to replace advice given to you by your health care provider. Make sure you discuss any questions you have with your health care provider.

## 2014-06-09 LAB — THYROID ANTIBODIES
Thyroglobulin Ab: <20 IU/mL (ref <40.0)
Thyroperoxidase Ab SerPl-aCnc: <10 IU/mL (ref <35.0)

## 2014-06-09 LAB — BASIC METABOLIC PANEL
BUN: 7 mg/dL (ref 6–23)
CO2: 30 mEq/L (ref 19–32)
Calcium: 9 mg/dL (ref 8.4–10.5)
Chloride: 106 mEq/L (ref 96–112)
Creatinine, Ser: 0.8 mg/dL (ref 0.4–1.2)
GFR: 77.72 mL/min (ref >60.00)
Glucose, Bld: 82 mg/dL (ref 70–99)
Potassium: 3.8 mEq/L (ref 3.5–5.1)
Sodium: 141 mEq/L (ref 135–145)

## 2014-06-09 LAB — HEPATIC FUNCTION PANEL
ALT: 15 U/L (ref 0–35)
AST: 19 U/L (ref 0–37)
Albumin: 3.7 g/dL (ref 3.5–5.2)
Alkaline Phosphatase: 75 U/L (ref 39–117)
Bilirubin, Direct: 0.1 mg/dL (ref 0.0–0.3)
Total Bilirubin: 0.3 mg/dL (ref 0.2–1.2)
Total Protein: 6.6 g/dL (ref 6.0–8.3)

## 2014-06-09 LAB — T4, FREE: Free T4: 0.89 ng/dL (ref 0.60–1.60)

## 2014-06-09 LAB — TSH: TSH: 0.64 u[IU]/mL (ref 0.35–4.50)

## 2014-06-10 LAB — CULTURE, GROUP A STREP: Organism ID, Bacteria: NORMAL

## 2014-09-04 ENCOUNTER — Other Ambulatory Visit: Payer: Managed Care, Other (non HMO)

## 2014-09-10 ENCOUNTER — Other Ambulatory Visit (INDEPENDENT_AMBULATORY_CARE_PROVIDER_SITE_OTHER): Payer: Managed Care, Other (non HMO)

## 2014-09-10 DIAGNOSIS — Z0000 Encounter for general adult medical examination without abnormal findings: Secondary | ICD-10-CM

## 2014-09-10 LAB — LIPID PANEL
Cholesterol: 222 mg/dL — ABNORMAL HIGH (ref 0–200)
HDL: 57.5 mg/dL (ref >39.00)
LDL Cholesterol: 145 mg/dL — ABNORMAL HIGH (ref 0–99)
NonHDL: 164.5
Total CHOL/HDL Ratio: 4
Triglycerides: 99 mg/dL (ref 0.0–149.0)
VLDL: 19.8 mg/dL (ref 0.0–40.0)

## 2014-09-10 LAB — BASIC METABOLIC PANEL
BUN: 11 mg/dL (ref 6–23)
CO2: 20 mEq/L (ref 19–32)
Calcium: 8.8 mg/dL (ref 8.4–10.5)
Chloride: 101 mEq/L (ref 96–112)
Creatinine, Ser: 0.9 mg/dL (ref 0.4–1.2)
GFR: 71.69 mL/min (ref >60.00)
Glucose, Bld: 69 mg/dL — ABNORMAL LOW (ref 70–99)
Potassium: 4.1 mEq/L (ref 3.5–5.1)
Sodium: 137 mEq/L (ref 135–145)

## 2014-09-10 LAB — CBC WITH DIFFERENTIAL/PLATELET
Basophils Absolute: 0 10*3/uL (ref 0.0–0.1)
Basophils Relative: 0.7 % (ref 0.0–3.0)
Eosinophils Absolute: 0.1 10*3/uL (ref 0.0–0.7)
Eosinophils Relative: 1.5 % (ref 0.0–5.0)
HCT: 40.3 % (ref 36.0–46.0)
Hemoglobin: 13.4 g/dL (ref 12.0–15.0)
Lymphocytes Relative: 40.3 % (ref 12.0–46.0)
Lymphs Abs: 2.2 10*3/uL (ref 0.7–4.0)
MCHC: 33.2 g/dL (ref 30.0–36.0)
MCV: 89.8 fl (ref 78.0–100.0)
Monocytes Absolute: 0.3 10*3/uL (ref 0.1–1.0)
Monocytes Relative: 5.6 % (ref 3.0–12.0)
Neutro Abs: 2.8 10*3/uL (ref 1.4–7.7)
Neutrophils Relative %: 51.9 % (ref 43.0–77.0)
Platelets: 304 10*3/uL (ref 150.0–400.0)
RBC: 4.48 Mil/uL (ref 3.87–5.11)
RDW: 14 % (ref 11.5–15.5)
WBC: 5.4 10*3/uL (ref 4.0–10.5)

## 2014-09-10 LAB — HEPATIC FUNCTION PANEL
ALT: 20 U/L (ref 0–35)
AST: 24 U/L (ref 0–37)
Albumin: 3.6 g/dL (ref 3.5–5.2)
Alkaline Phosphatase: 68 U/L (ref 39–117)
Bilirubin, Direct: 0 mg/dL (ref 0.0–0.3)
Total Bilirubin: 0.5 mg/dL (ref 0.2–1.2)
Total Protein: 7.6 g/dL (ref 6.0–8.3)

## 2014-09-10 LAB — TSH: TSH: 0.67 u[IU]/mL (ref 0.35–4.50)

## 2014-09-11 ENCOUNTER — Ambulatory Visit (INDEPENDENT_AMBULATORY_CARE_PROVIDER_SITE_OTHER): Payer: Managed Care, Other (non HMO) | Admitting: Internal Medicine

## 2014-09-11 ENCOUNTER — Encounter: Payer: Self-pay | Admitting: Internal Medicine

## 2014-09-11 VITALS — BP 104/72 | Temp 98.3°F | Ht 66.0 in | Wt 158.7 lb

## 2014-09-11 DIAGNOSIS — Z0000 Encounter for general adult medical examination without abnormal findings: Secondary | ICD-10-CM

## 2014-09-11 DIAGNOSIS — Z23 Encounter for immunization: Secondary | ICD-10-CM

## 2014-09-11 DIAGNOSIS — R1030 Lower abdominal pain, unspecified: Secondary | ICD-10-CM

## 2014-09-11 DIAGNOSIS — R350 Frequency of micturition: Secondary | ICD-10-CM

## 2014-09-11 DIAGNOSIS — E785 Hyperlipidemia, unspecified: Secondary | ICD-10-CM

## 2014-09-11 DIAGNOSIS — Z79899 Other long term (current) drug therapy: Secondary | ICD-10-CM

## 2014-09-11 LAB — POCT URINALYSIS DIP (MANUAL ENTRY)
Bilirubin, UA: NEGATIVE
Blood, UA: NEGATIVE
Glucose, UA: NEGATIVE
Leukocytes, UA: NEGATIVE
Nitrite, UA: NEGATIVE
Protein Ur, POC: NEGATIVE
Spec Grav, UA: <=1.005
Urobilinogen, UA: 0.2
pH, UA: 6

## 2014-09-11 MED ORDER — NITROFURANTOIN MONOHYD MACRO 100 MG PO CAPS: 100.0000 mg | ORAL_CAPSULE | Freq: Two times a day (BID) | ORAL | Status: DC

## 2014-09-11 NOTE — Patient Instructions (Addendum)
Continue lifestyle intervention healthy eating and exercise . Urine is clear today . Can add antibiotic if needed over the weekend Please have your gyne check on the abd sx.  Non specific today . Can try lowest dose of the lexapro 1/4 tab 3 days per week or such .  Healthy lifestyle includes : At least 150 minutes of exercise weeks  , weight at healthy levels, which is usually   BMI 19-25. Avoid trans fats and processed foods;  Increase fresh fruits and veges to 5 servings per day. And avoid sweet beverages including tea and juice. Mediterranean diet with olive oil and nuts have been noted to be heart and brain healthy . Avoid tobacco products . Limit  alcohol to  7 per week for women and 14 servings for men.  Get adequate sleep . Wear seat belts . Don't text and drive .   Check up in a year or as needed

## 2014-09-11 NOTE — Progress Notes (Signed)
Pre visit review using our clinic review tool, if applicable. No additional management support is needed unless otherwise documented below in the visit note.  Chief Complaint  Patient presents with  . Annual Exam    abd pressure pain    HPI: Patient comes in today for Preventive Health Care visit   No major changes in health but does have a new problem lower abdominal pressure discomfort off and on but increase more recently wonders if she could have a UTI  ? Pressure pain lower area off and on dysuria   Woke up the other night .   Period due in a week. Minor cramps. No fever chills blood in urine  Has been on Toviaz: Since had Fourth degree  Helping incontinence. Per gyne.   Major change in bowel habits. Has had a colonoscopy on 10 you recall.  Taking the Lexapro 5 mg very hard to get off gets withdrawal symptom seems to calm things down only takes Xanax basically for flying once or twice a year the most even though she flies on a regular basis.  Health Maintenance  Topic Date Due  . Influenza Vaccine  06/27/2014  . Pap Smear  11/29/2015  . Tetanus/tdap  09/09/2023   Health Maintenance Review LIFESTYLE:  Exercise:  Sporadic  Tobacco/ETS: no Alcohol: 1-2 per day. Sugar beverages: none Sleep:at least 8  Drug use: no Colonoscopy: 10 year recall   Hs of polyp.  Brodie   ROS:  GEN/ HEENT: No fever, significant weight changes sweats headaches vision problems hearing changes, CV/ PULM; No chest pain shortness of breath cough, syncope,edema  change in exercise tolerance. GI /GU: No , vomiting, change in bowel habits. No blood in the stool.  SKIN/HEME: ,no acute skin rashes suspicious lesions or bleeding. No lymphadenopathy, nodules, masses.  NEURO/ PSYCH:  No neurologic signs such as weakness numbness. No depression anxiety. IMM/ Allergy: No unusual infections.  Allergy .   REST of 12 system review negative except as per HPI   Past Medical History  Diagnosis Date  .  Headache(784.0)   . Migraine   . Hyperlipidemia   . Urinary incontinence     related to childbirth urge and stress [name] helps  . Hx of skin cancer, basal cell   . Hx of colonic polyps     5 year recall history of rectal bleeding  . History of syncope     with low bp orthostatic onset hs  . Anxiety   . GERD (gastroesophageal reflux disease)     pt hew symptoms, difficulty swallowing, lump in throat  . Internal hemorrhoids   . Agatston coronary artery calcium score less than 100 2013  . Gynecomastia 03/01/2012  . Environmental allergies     Family History  Problem Relation Age of Onset  . Hyperlipidemia Mother   . Depression Father   . Hypertension Father   . Other Father     tranverse mylitis  . Colon polyps Father   . Depression Brother   . Hypertension Brother   . Depression Daughter   . Anxiety disorder Daughter   . Allergies      Children  . Colon polyps Paternal Grandmother   . Colon cancer Neg Hx     History   Social History  . Marital Status: Married    Spouse Name: N/A    Number of Children: N/A  . Years of Education: N/A   Social History Main Topics  . Smoking status: Never Smoker   .  Smokeless tobacco: Never Used  . Alcohol Use: 4.2 oz/week    7 Glasses of wine per week  . Drug Use: No  . Sexual Activity: None   Other Topics Concern  . None   Social History Narrative   Occupation: Programmer, applications, Masters level education, Travels 4 days per week   Married   Regular exercise- yes   HH of 4   No pets   Recently moved from [place]   1 glass wine per night    Outpatient Encounter Prescriptions as of 09/11/2014  Medication Sig  . ALPRAZolam (XANAX) 0.25 MG tablet Take 1 tablet (0.25 mg total) by mouth 3 (three) times daily as needed for sleep. For panic attack  . levocetirizine (XYZAL) 5 MG tablet TAKE ONE TABLET BY MOUTH ONCE DAILY IN THE P.M.   . LEXAPRO 10 MG tablet Daily. Takes 1/2 to a whole tablet daily  . TOVIAZ 4 MG TB24  tablet Take 4 mg by mouth daily.   . nitrofurantoin, macrocrystal-monohydrate, (MACROBID) 100 MG capsule Take 1 capsule (100 mg total) by mouth 2 (two) times daily.    EXAM:  BP 104/72  Temp(Src) 98.3 F (36.8 C) (Oral)  Ht 5\' 6"  (1.676 m)  Wt 158 lb 11.2 oz (71.986 kg)  BMI 25.63 kg/m2  Body mass index is 25.63 kg/(m^2).  Physical Exam: Vital signs reviewed EGB:TDVV is a well-developed well-nourished alert cooperative    who appearsr stated age in no acute distress.  HEENT: normocephalic atraumatic , Eyes: PERRL EOM's full, conjunctiva clear, Nares: paten,t no deformity discharge or tenderness., Ears: no deformity EAC's clear TMs with normal landmarks. Mouth: clear OP, no lesions, edema.  Moist mucous membranes. Dentition in adequate repair. NECK: supple without masses, thyromegaly or bruits. CHEST/PULM:  Clear to auscultation and percussion breath sounds equal no wheeze , rales or rhonchi. No chest wall deformities or tenderness. CV: PMI is nondisplaced, S1 S2 no gallops, murmurs, rubs. Peripheral pulses are full without delay.No JVD .  ABDOMEN: Bowel sounds normal nontender  No guard or rebound, no hepato splenomegal no CVA tenderness.  No hernia. midl suprapubic tenderness without rebound. No masses  Extremtities:  No clubbing cyanosis or edema, no acute joint swelling or redness no focal atrophy NEURO:  Oriented x3, cranial nerves 3-12 appear to be intact, no obvious focal weakness,gait within normal limits no abnormal reflexes or asymmetrical SKIN: No acute rashes normal turgor, color, no bruising or petechiae. PSYCH: Oriented, good eye contact, no obvious depression anxiety, cognition and judgment appear normal. LN: no cervical axillary inguinal adenopathy  Lab Results  Component Value Date   WBC 5.4 09/10/2014   HGB 13.4 09/10/2014   HCT 40.3 09/10/2014   PLT 304.0 09/10/2014   GLUCOSE 69* 09/10/2014   CHOL 222* 09/10/2014   TRIG 99.0 09/10/2014   HDL 57.50 09/10/2014    LDLDIRECT 139.3 09/04/2013   LDLCALC 145* 09/10/2014   ALT 20 09/10/2014   AST 24 09/10/2014   NA 137 09/10/2014   K 4.1 09/10/2014   CL 101 09/10/2014   CREATININE 0.9 09/10/2014   BUN 11 09/10/2014   CO2 20 09/10/2014   TSH 0.67 09/10/2014    ASSESSMENT AND PLAN:  Discussed the following assessment and plan:  Encounter for preventive health examination  Medication management - try 1/4-/1/2 lexapro lowest dose to suppress aniety. rare use of alprazolam helps to have on hand takes about 1-2 per year  Hyperlipidemia - stable healthy ls  Need for prophylactic  vaccination and inoculation against influenza - Plan: Flu Vaccine QUAD 36+ mos PF IM (Fluarix Quad PF)  Suprapubic pain, unspecified laterality - ua clear rx given for macrobid for over eweekend if acts more like uti try otc azo thype meds see gyne consider uro to check  - Plan: POCT urinalysis dipstick  Urinary frequency - Plan: POCT urinalysis dipstick  Patient Care Team: Burnis Medin, MD as PCP - General Lafayette Dragon, MD (Gastroenterology) Jarome Matin, MD (Dermatology) kortesis (Plastic Surgery) Farrel Gobble. Harrington Challenger, MD as Consulting Physician (Obstetrics and Gynecology) Patient Instructions  Continue lifestyle intervention healthy eating and exercise . Urine is clear today . Can add antibiotic if needed over the weekend Please have your gyne check on the abd sx.  Non specific today . Can try lowest dose of the lexapro 1/4 tab 3 days per week or such .  Healthy lifestyle includes : At least 150 minutes of exercise weeks  , weight at healthy levels, which is usually   BMI 19-25. Avoid trans fats and processed foods;  Increase fresh fruits and veges to 5 servings per day. And avoid sweet beverages including tea and juice. Mediterranean diet with olive oil and nuts have been noted to be heart and brain healthy . Avoid tobacco products . Limit  alcohol to  7 per week for women and 14 servings for men.  Get adequate sleep  . Wear seat belts . Don't text and drive .   Check up in a year or as needed     Standley Brooking. Panosh M.D.

## 2014-09-28 ENCOUNTER — Encounter: Payer: Self-pay | Admitting: Internal Medicine

## 2015-01-26 ENCOUNTER — Ambulatory Visit (INDEPENDENT_AMBULATORY_CARE_PROVIDER_SITE_OTHER): Payer: Managed Care, Other (non HMO) | Admitting: Internal Medicine

## 2015-01-26 ENCOUNTER — Encounter: Payer: Self-pay | Admitting: Internal Medicine

## 2015-01-26 VITALS — BP 102/72 | Temp 98.4°F | Ht 66.0 in | Wt 161.0 lb

## 2015-01-26 DIAGNOSIS — R399 Unspecified symptoms and signs involving the genitourinary system: Secondary | ICD-10-CM

## 2015-01-26 DIAGNOSIS — R1030 Lower abdominal pain, unspecified: Secondary | ICD-10-CM

## 2015-01-26 DIAGNOSIS — M5489 Other dorsalgia: Secondary | ICD-10-CM

## 2015-01-26 DIAGNOSIS — N921 Excessive and frequent menstruation with irregular cycle: Secondary | ICD-10-CM

## 2015-01-26 LAB — POCT URINALYSIS DIPSTICK
Bilirubin, UA: NEGATIVE
Blood, UA: NEGATIVE
Glucose, UA: NEGATIVE
Ketones, UA: NEGATIVE
Nitrite, UA: NEGATIVE
Protein, UA: NEGATIVE
Spec Grav, UA: 1.01
Urobilinogen, UA: 0.2
pH, UA: 5.5

## 2015-01-26 NOTE — Patient Instructions (Signed)
Take 2 aleve twice a day  For about 7 days  Will do culture to check for uti infection atypical. If  persistent or progressive consider more evaluation.  I want you to see you gyne  Because of location of pain and hx  of  Break through bleeding . ? Cyst enndometriosis .  Attend to back hygiene.   Avoid prolonged sitting heavy lifting  Exercise walking is ok.

## 2015-01-26 NOTE — Progress Notes (Signed)
Pre visit review using our clinic review tool, if applicable. No additional management support is needed unless otherwise documented below in the visit note. ] Chief Complaint  Patient presents with  . Back Pain    Ongoing for 1.5 wks.  Also says her urine flow is slower.  . Abdominal Pain    HPI: Patient Tamara Watkins  comes in today for SDA for  new problem evaluation.  Left lower pain in back insidious onset about 10 days ago and them progressed to bilateral back pain   Had some radiation? To lower abd pelvis on left and now bilateral .  Then now  Both sides and abd worse  And and doewn further    No fever change in bowel habits  And urine not as easy. But no dysuria  Cramps in menses  Sometimes sharp . Is mid cycle  husband has  vasectomy and no vag sx  Took  advil no help . Pain level varies 5-6 to  8-9  Stopped toviaz cause  No less UI  No constipation diarrhea  ROS: See pertinent positives and negatives per HPI.no hematuria  Has traveled no injury back pain worse with sitting   Past Medical History  Diagnosis Date  . Headache(784.0)   . Migraine   . Hyperlipidemia   . Urinary incontinence     related to childbirth urge and stress [name] helps  . Hx of skin cancer, basal cell   . Hx of colonic polyps     5 year recall history of rectal bleeding  . History of syncope     with low bp orthostatic onset hs  . Anxiety   . GERD (gastroesophageal reflux disease)     pt hew symptoms, difficulty swallowing, lump in throat  . Internal hemorrhoids   . Agatston coronary artery calcium score less than 100 2013  . Gynecomastia 03/01/2012  . Environmental allergies     Family History  Problem Relation Age of Onset  . Hyperlipidemia Mother   . Depression Father   . Hypertension Father   . Other Father     tranverse mylitis  . Colon polyps Father   . Depression Brother   . Hypertension Brother   . Depression Daughter   . Anxiety disorder Daughter   . Allergies      Children    . Colon polyps Paternal Grandmother   . Colon cancer Neg Hx     History   Social History  . Marital Status: Married    Spouse Name: N/A  . Number of Children: N/A  . Years of Education: N/A   Social History Main Topics  . Smoking status: Never Smoker   . Smokeless tobacco: Never Used  . Alcohol Use: 4.2 oz/week    7 Glasses of wine per week  . Drug Use: No  . Sexual Activity: Not on file   Other Topics Concern  . None   Social History Narrative   Occupation: Programmer, applications, Masters level education, Travels 4 days per week   Married   Regular exercise- yes   HH of 4   No pets   Recently moved from [place]   1 glass wine per night    Outpatient Encounter Prescriptions as of 01/26/2015  Medication Sig  . ALPRAZolam (XANAX) 0.25 MG tablet Take 1 tablet (0.25 mg total) by mouth 3 (three) times daily as needed for sleep. For panic attack  . levocetirizine (XYZAL) 5 MG tablet TAKE ONE TABLET  BY MOUTH ONCE DAILY IN THE P.M.   . LEXAPRO 10 MG tablet Daily. Takes 1/2 to a whole tablet daily  . TOVIAZ 4 MG TB24 tablet Take 4 mg by mouth daily.   . [DISCONTINUED] nitrofurantoin, macrocrystal-monohydrate, (MACROBID) 100 MG capsule Take 1 capsule (100 mg total) by mouth 2 (two) times daily.    EXAM:  BP 102/72 mmHg  Temp(Src) 98.4 F (36.9 C) (Oral)  Ht 5\' 6"  (1.676 m)  Wt 161 lb (73.029 kg)  BMI 26.00 kg/m2  Body mass index is 26 kg/(m^2).  GENERAL: vitals reviewed and listed above, alert, oriented, appears well hydrated and in no acute distress HEENT: atraumatic, conjunctiva  clear, no obvious abnormalities on inspection of external nose and ears LUNGS: clear to auscultation bilaterally, no wheezes, rales or rhonchi, good air movement CV: HRRR, no clubbing cyanosis or  peripheral edema nl cap refill  Abdomen:  Sof,t normal bowel sounds without hepatosplenomegaly, no guarding rebound or masses no CVA tenderness pooints to bilateral low back abd sore suprapubic and  plateral to no masses  No psoas sign  MS: moves all extremities without noticeable focal  abnormality PSYCH: pleasant and cooperative, no obvious depression or anxiety Lab Results  Component Value Date   WBC 5.4 09/10/2014   HGB 13.4 09/10/2014   HCT 40.3 09/10/2014   PLT 304.0 09/10/2014   GLUCOSE 69* 09/10/2014   CHOL 222* 09/10/2014   TRIG 99.0 09/10/2014   HDL 57.50 09/10/2014   LDLDIRECT 139.3 09/04/2013   LDLCALC 145* 09/10/2014   ALT 20 09/10/2014   AST 24 09/10/2014   NA 137 09/10/2014   K 4.1 09/10/2014   CL 101 09/10/2014   CREATININE 0.9 09/10/2014   BUN 11 09/10/2014   CO2 20 09/10/2014   TSH 0.67 09/10/2014  ua neg  Except trace leuk on DS   ASSESSMENT AND PLAN:  Discussed the following assessment and plan:  Other back pain - sounds mechanical but not typical poss radiation - Plan: POC Urinalysis Dipstick, Culture, Urine  Lower abdominal pain - suprapubic pain  with back to radiation  - Plan: POC Urinalysis Dipstick, Culture, Urine  Breakthrough bleeding mid cycle  - remote hx of same but not with pain.  Lower urinary tract symptoms (LUTS) Uncertain cause  Has btb could be ovuilartory uncertain if related to back pain but poss to lower abd pelvic pain .  Low risk other infection r/o uti  Pt to get appt with GYNE about btb lower abd pain  Fu in 1-3 weeks depending on progress . consdier imagine if needed -Patient advised to return or notify health care team  if symptoms worsen ,persist or new concerns arise.  Patient Instructions  Take 2 aleve twice a day  For about 7 days  Will do culture to check for uti infection atypical. If  persistent or progressive consider more evaluation.  I want you to see you gyne  Because of location of pain and hx  of  Break through bleeding . ? Cyst enndometriosis .  Attend to back hygiene.   Avoid prolonged sitting heavy lifting  Exercise walking is ok.       Standley Brooking. Panosh M.D.

## 2015-01-27 DIAGNOSIS — N921 Excessive and frequent menstruation with irregular cycle: Secondary | ICD-10-CM | POA: Insufficient documentation

## 2015-01-29 LAB — URINE CULTURE: Colony Count: 35000

## 2015-02-05 MED ORDER — CEPHALEXIN 500 MG PO CAPS: 500.0000 mg | ORAL_CAPSULE | Freq: Three times a day (TID) | ORAL | Status: DC

## 2015-02-05 NOTE — Addendum Note (Signed)
Addended by: Colleen Can on: 02/05/2015 11:54 AM   Modules accepted: Orders

## 2015-07-20 ENCOUNTER — Telehealth: Payer: Self-pay | Admitting: Internal Medicine

## 2015-07-20 DIAGNOSIS — M25569 Pain in unspecified knee: Secondary | ICD-10-CM

## 2015-07-20 DIAGNOSIS — M25579 Pain in unspecified ankle and joints of unspecified foot: Secondary | ICD-10-CM

## 2015-07-20 DIAGNOSIS — M79643 Pain in unspecified hand: Secondary | ICD-10-CM

## 2015-07-20 NOTE — Telephone Encounter (Signed)
Ok to refer  But I havent seen her for this.

## 2015-07-20 NOTE — Telephone Encounter (Signed)
Pt call to ask for a referral to see [facility]  Reason;  hand feet and knee joint issues for about 3 months

## 2015-07-21 NOTE — Addendum Note (Signed)
Addended by: Miles Costain T on: 07/21/2015 02:16 PM   Modules accepted: Orders

## 2015-07-21 NOTE — Telephone Encounter (Signed)
Referral placed in the system. 

## 2015-09-01 ENCOUNTER — Ambulatory Visit (INDEPENDENT_AMBULATORY_CARE_PROVIDER_SITE_OTHER): Payer: Managed Care, Other (non HMO) | Admitting: Family Medicine

## 2015-09-01 DIAGNOSIS — Z23 Encounter for immunization: Secondary | ICD-10-CM | POA: Diagnosis not present

## 2016-03-01 LAB — HM MAMMOGRAPHY: HM Mammogram: NORMAL (ref 0–4)

## 2016-03-29 ENCOUNTER — Encounter: Payer: Self-pay | Admitting: Internal Medicine

## 2016-03-29 ENCOUNTER — Ambulatory Visit (INDEPENDENT_AMBULATORY_CARE_PROVIDER_SITE_OTHER): Payer: 59 | Admitting: Internal Medicine

## 2016-03-29 ENCOUNTER — Ambulatory Visit (INDEPENDENT_AMBULATORY_CARE_PROVIDER_SITE_OTHER)
Admission: RE | Admit: 2016-03-29 | Discharge: 2016-03-29 | Disposition: A | Discharge status: Home / Self Care | Payer: 59 | Source: Ambulatory Visit | Attending: Internal Medicine | Admitting: Internal Medicine

## 2016-03-29 VITALS — BP 116/74 | HR 77 | Temp 98.5°F | Wt 162.9 lb

## 2016-03-29 DIAGNOSIS — R5383 Other fatigue: Secondary | ICD-10-CM | POA: Diagnosis not present

## 2016-03-29 DIAGNOSIS — F419 Anxiety disorder, unspecified: Secondary | ICD-10-CM

## 2016-03-29 DIAGNOSIS — R05 Cough: Secondary | ICD-10-CM | POA: Diagnosis not present

## 2016-03-29 DIAGNOSIS — R059 Cough, unspecified: Secondary | ICD-10-CM

## 2016-03-29 DIAGNOSIS — R6883 Chills (without fever): Secondary | ICD-10-CM

## 2016-03-29 DIAGNOSIS — R0789 Other chest pain: Secondary | ICD-10-CM

## 2016-03-29 DIAGNOSIS — R0602 Shortness of breath: Secondary | ICD-10-CM

## 2016-03-29 LAB — CBC WITH DIFFERENTIAL/PLATELET
Basophils Absolute: 0 10*3/uL (ref 0.0–0.1)
Basophils Relative: 0.6 % (ref 0.0–3.0)
Eosinophils Absolute: 0.1 10*3/uL (ref 0.0–0.7)
Eosinophils Relative: 1.1 % (ref 0.0–5.0)
HCT: 40.2 % (ref 36.0–46.0)
Hemoglobin: 13.6 g/dL (ref 12.0–15.0)
Lymphocytes Relative: 30.9 % (ref 12.0–46.0)
Lymphs Abs: 2.1 10*3/uL (ref 0.7–4.0)
MCHC: 33.9 g/dL (ref 30.0–36.0)
MCV: 88.4 fl (ref 78.0–100.0)
Monocytes Absolute: 0.3 10*3/uL (ref 0.1–1.0)
Monocytes Relative: 4.1 % (ref 3.0–12.0)
Neutro Abs: 4.3 10*3/uL (ref 1.4–7.7)
Neutrophils Relative %: 63.3 % (ref 43.0–77.0)
Platelets: 299 10*3/uL (ref 150.0–400.0)
RBC: 4.55 Mil/uL (ref 3.87–5.11)
RDW: 13.9 % (ref 11.5–15.5)
WBC: 6.8 10*3/uL (ref 4.0–10.5)

## 2016-03-29 LAB — COMPREHENSIVE METABOLIC PANEL
ALT: 23 U/L (ref 0–35)
AST: 20 U/L (ref 0–37)
Albumin: 4.1 g/dL (ref 3.5–5.2)
Alkaline Phosphatase: 69 U/L (ref 39–117)
BUN: 13 mg/dL (ref 6–23)
CO2: 26 mEq/L (ref 19–32)
Calcium: 9.1 mg/dL (ref 8.4–10.5)
Chloride: 103 mEq/L (ref 96–112)
Creatinine, Ser: 0.79 mg/dL (ref 0.40–1.20)
GFR: 82.77 mL/min (ref >60.00)
Glucose, Bld: 89 mg/dL (ref 70–99)
Potassium: 3.9 mEq/L (ref 3.5–5.1)
Sodium: 137 mEq/L (ref 135–145)
Total Bilirubin: 0.4 mg/dL (ref 0.2–1.2)
Total Protein: 6.9 g/dL (ref 6.0–8.3)

## 2016-03-29 LAB — SEDIMENTATION RATE: Sed Rate: 6 mm/hr (ref 0–22)

## 2016-03-29 LAB — TSH: TSH: 0.9 u[IU]/mL (ref 0.35–4.50)

## 2016-03-29 NOTE — Progress Notes (Signed)
Pre visit review using our clinic review tool, if applicable. No additional management support is needed unless otherwise documented below in the visit note.  Chief Complaint  Patient presents with  . Fatigue  . Dizziness    X4wks.  Mother diagnosed with PNA  . Shortness of Breath  . Cough    HPI: Tamara Watkins 50 y.o.  Comes in for sda appt   Mom has pna   Has had 3-4 week s   of  Fatigue dizziness sob   upper chest  tightness  Ans sore throat like esophagus is swollen and sob  Cant tell if from anxiety or other   To travel   Soon  Dry cough  recenetly no  Pleuritic pain .  Abel to take walks   No active wheeze or hx of same .  No dysphagia  No hx clotting  Feels very tired and wants to sleep . Never had mono.  No preg lmp 3 weeks ago  No changing meds  Feels chills at times no fever  Achy .  Doesn't feel well   Tried antacids no help  Does travel onset at beach 3-4 weeks ago  ROS: See pertinent positives and negatives per HPI.  Past Medical History  Diagnosis Date  . Headache(784.0)   . Migraine   . Hyperlipidemia   . Urinary incontinence     related to childbirth urge and stress [name] helps  . Hx of skin cancer, basal cell   . Hx of colonic polyps     5 year recall history of rectal bleeding  . History of syncope     with low bp orthostatic onset hs  . Anxiety   . GERD (gastroesophageal reflux disease)     pt hew symptoms, difficulty swallowing, lump in throat  . Internal hemorrhoids   . Agatston coronary artery calcium score less than 100 2013  . Gynecomastia 03/01/2012  . Environmental allergies     Family History  Problem Relation Age of Onset  . Hyperlipidemia Mother   . Depression Father   . Hypertension Father   . Other Father     tranverse mylitis  . Colon polyps Father   . Depression Brother   . Hypertension Brother   . Depression Daughter   . Anxiety disorder Daughter   . Allergies      Children  . Colon polyps Paternal Grandmother   . Colon cancer  Neg Hx     Social History   Social History  . Marital Status: Married    Spouse Name: N/A  . Number of Children: N/A  . Years of Education: N/A   Social History Main Topics  . Smoking status: Never Smoker   . Smokeless tobacco: Never Used  . Alcohol Use: 4.2 oz/week    7 Glasses of wine per week  . Drug Use: No  . Sexual Activity: Not Asked   Other Topics Concern  . None   Social History Narrative   Occupation: Programmer, applications, Masters level education, Travels 4 days per week   Married   Regular exercise- yes   HH of 4   No pets   Recently moved from [place]   1 glass wine per night    Outpatient Prescriptions Prior to Visit  Medication Sig Dispense Refill  . ALPRAZolam (XANAX) 0.25 MG tablet Take 1 tablet (0.25 mg total) by mouth 3 (three) times daily as needed for sleep. For panic attack 30 tablet 0  .  LEXAPRO 10 MG tablet Daily. Takes 1/2 to a whole tablet daily    . cephALEXin (KEFLEX) 500 MG capsule Take 1 capsule (500 mg total) by mouth 3 (three) times daily. 15 capsule 0  . levocetirizine (XYZAL) 5 MG tablet TAKE ONE TABLET BY MOUTH ONCE DAILY IN THE P.M.  30 tablet 3  . TOVIAZ 4 MG TB24 tablet Take 4 mg by mouth daily.      No facility-administered medications prior to visit.     EXAM:  BP 116/74 mmHg  Pulse 77  Temp(Src) 98.5 F (36.9 C) (Oral)  Wt 162 lb 14.4 oz (73.891 kg)  SpO2 98%  Body mass index is 26.31 kg/(m^2).  GENERAL: vitals reviewed and listed above, alert, oriented, appears well hydrated and in no acute distress HEENT: atraumatic, conjunctiva  clear, no obvious abnormalities on inspection of external nose and ears tm nlOP : no lesion edema or exudate  Drainage tracts? No face pain  NECK: no obvious masses on inspection palpation  LUNGS: clear to auscultation bilaterally, no wheezes, rales or rhonchi, good air movement CV: HRRR, no clubbing cyanosis or  peripheral edema nl cap refill  Abdomen:  Sof,t normal bowel sounds  without hepatosplenomegaly, no guarding rebound or masses no CVA tenderness MS: moves all extremities without noticeable focal  Abnormality no redness or swelling  PSYCH: pleasant and cooperative, no obvious depression or anxiety  ASSESSMENT AND PLAN:  Discussed the following assessment and plan:  Other fatigue - Plan: CBC with Differential/Platelet, TSH, Sedimentation rate, Comprehensive metabolic panel, Epstein-Barr virus VCA antibody panel, D-dimer, Quantitative, DG Chest 2 View  Chills (without fever) - Plan: CBC with Differential/Platelet, TSH, Sedimentation rate, Comprehensive metabolic panel, Epstein-Barr virus VCA antibody panel, D-dimer, Quantitative, DG Chest 2 View  Shortness of breath - Plan: CBC with Differential/Platelet, TSH, Sedimentation rate, Comprehensive metabolic panel, Epstein-Barr virus VCA antibody panel, D-dimer, Quantitative, DG Chest 2 View  Cough - Plan: DG Chest 2 View  Anxiety  Chest tightness Poss viral infection  But persistent and fatigue   Sob uncertain if from anxiety or illness but has recently had a dry mild cough  Consider reflux also    Labs and x ray  Fu as appropriate  doesn't sound cardiac in cause and not high risk  revewied risk benfit of getting d dimer testing and fu  -Patient advised to return or notify health care team  if symptoms worsen ,persist or new concerns arise.  Patient Instructions  Your exam is reassuring  Acts like a viral resp infection but should be better for now.  Exam is good today.  Drainage and throat could be acid reflux .    Try ranitidine 150 mg 1 twice a day for 2 weeks   And then as needed. Get lab andxray today  To decide if further intervention needed.      Standley Brooking. Renardo Cheatum M.D.

## 2016-03-29 NOTE — Patient Instructions (Signed)
Your exam is reassuring  Acts like a viral resp infection but should be better for now.  Exam is good today.  Drainage and throat could be acid reflux .    Try ranitidine 150 mg 1 twice a day for 2 weeks   And then as needed. Get lab andxray today  To decide if further intervention needed.

## 2016-03-30 LAB — EPSTEIN-BARR VIRUS VCA ANTIBODY PANEL
EBV EA IgG: <5 U/mL (ref <9.0)
EBV NA IgG: 169 U/mL — ABNORMAL HIGH (ref <18.0)
EBV VCA IgG: 281 U/mL — ABNORMAL HIGH (ref <18.0)
EBV VCA IgM: <10 U/mL (ref <36.0)

## 2016-03-30 LAB — D-DIMER, QUANTITATIVE (NOT AT ARMC): D-Dimer, Quant: 0.31 ug/mL-FEU (ref 0.00–0.48)

## 2016-03-31 ENCOUNTER — Telehealth: Payer: Self-pay | Admitting: Internal Medicine

## 2016-03-31 NOTE — Telephone Encounter (Signed)
Pt would like to have lab results from 03/29/16.

## 2016-03-31 NOTE — Telephone Encounter (Signed)
Pt notified of results by telephone.  See result note. 

## 2016-05-12 ENCOUNTER — Other Ambulatory Visit: Payer: Self-pay | Admitting: Family Medicine

## 2016-05-12 DIAGNOSIS — Z0000 Encounter for general adult medical examination without abnormal findings: Secondary | ICD-10-CM

## 2016-05-15 ENCOUNTER — Other Ambulatory Visit: Payer: Managed Care, Other (non HMO)

## 2016-05-22 ENCOUNTER — Encounter: Payer: Managed Care, Other (non HMO) | Admitting: Internal Medicine

## 2016-06-28 ENCOUNTER — Other Ambulatory Visit (INDEPENDENT_AMBULATORY_CARE_PROVIDER_SITE_OTHER): Payer: 59

## 2016-06-28 DIAGNOSIS — Z0000 Encounter for general adult medical examination without abnormal findings: Secondary | ICD-10-CM | POA: Diagnosis not present

## 2016-06-28 LAB — BASIC METABOLIC PANEL
BUN: 9 mg/dL (ref 6–23)
CO2: 22 mEq/L (ref 19–32)
Calcium: 8.8 mg/dL (ref 8.4–10.5)
Chloride: 105 mEq/L (ref 96–112)
Creatinine, Ser: 0.79 mg/dL (ref 0.40–1.20)
GFR: 82.68 mL/min (ref >60.00)
Glucose, Bld: 86 mg/dL (ref 70–99)
Potassium: 4.2 mEq/L (ref 3.5–5.1)
Sodium: 140 mEq/L (ref 135–145)

## 2016-06-28 LAB — LIPID PANEL
Cholesterol: 225 mg/dL — ABNORMAL HIGH (ref 0–200)
HDL: 57.8 mg/dL (ref >39.00)
LDL Cholesterol: 141 mg/dL — ABNORMAL HIGH (ref 0–99)
NonHDL: 167.36
Total CHOL/HDL Ratio: 4
Triglycerides: 131 mg/dL (ref 0.0–149.0)
VLDL: 26.2 mg/dL (ref 0.0–40.0)

## 2016-06-28 LAB — CBC WITH DIFFERENTIAL/PLATELET
Basophils Absolute: 0 10*3/uL (ref 0.0–0.1)
Basophils Relative: 0.8 % (ref 0.0–3.0)
Eosinophils Absolute: 0.1 10*3/uL (ref 0.0–0.7)
Eosinophils Relative: 2.9 % (ref 0.0–5.0)
HCT: 38.1 % (ref 36.0–46.0)
Hemoglobin: 12.9 g/dL (ref 12.0–15.0)
Lymphocytes Relative: 53.2 % — ABNORMAL HIGH (ref 12.0–46.0)
Lymphs Abs: 2.6 10*3/uL (ref 0.7–4.0)
MCHC: 33.9 g/dL (ref 30.0–36.0)
MCV: 88.7 fl (ref 78.0–100.0)
Monocytes Absolute: 0.3 10*3/uL (ref 0.1–1.0)
Monocytes Relative: 5.7 % (ref 3.0–12.0)
Neutro Abs: 1.8 10*3/uL (ref 1.4–7.7)
Neutrophils Relative %: 37.4 % — ABNORMAL LOW (ref 43.0–77.0)
Platelets: 342 10*3/uL (ref 150.0–400.0)
RBC: 4.3 Mil/uL (ref 3.87–5.11)
RDW: 14.3 % (ref 11.5–15.5)
WBC: 4.9 10*3/uL (ref 4.0–10.5)

## 2016-06-28 LAB — HEPATIC FUNCTION PANEL
ALT: 14 U/L (ref 0–35)
AST: 14 U/L (ref 0–37)
Albumin: 4 g/dL (ref 3.5–5.2)
Alkaline Phosphatase: 70 U/L (ref 39–117)
Bilirubin, Direct: 0 mg/dL (ref 0.0–0.3)
Total Bilirubin: 0.3 mg/dL (ref 0.2–1.2)
Total Protein: 6.6 g/dL (ref 6.0–8.3)

## 2016-06-28 LAB — TSH: TSH: 1.37 u[IU]/mL (ref 0.35–4.50)

## 2016-07-04 NOTE — Progress Notes (Signed)
Pre visit review using our clinic review tool, if applicable. No additional management support is needed unless otherwise documented below in the visit note.  Chief Complaint  Patient presents with  . Annual Exam    HPI: Patient  Tamara Watkins  50 y.o. comes in today for Preventive Health Care visit  Sometime blood after loose stools  Hx of colonoscopy polyp firs anad then clean  No other sx ? I fimportant . HB taking 150 zantac once a day Taking lexapro again with moms illness but doing well now rare use or alprazolam   Mom   Lung cancer NS and   Minimal ets.  infant to 10 years.  Gm  Passed at 78 and p age 23  Health Maintenance  Topic Date Due  . INFLUENZA VACCINE  06/27/2016  . HIV Screening  07/05/2017 (Originally 11/03/1981)  . PAP SMEAR  12/07/2017  . TETANUS/TDAP  09/09/2023   Health Maintenance Review LIFESTYLE:  Exercise:  Needs to improve.  Tobacco/ETS:no Alcohol:  Moderate   3 x per week  Sugar beverages:  no Sleep: 8 hours  Drug use: no hh of 4     ROS:  Acid reflux  Zantac   One a day sometimes 2  GEN/ HEENT: No fever, significant weight changes sweats headaches vision problems hearing changes, CV/ PULM; No chest pain shortness of breath cough, syncope,edema  change in exercise tolerance. GI /GU: No adominal pain, vomiting, change in bowel habits. No blood in the stool. No significant GU symptoms. SKIN/HEME: ,no acute skin rashes suspicious lesions or bleeding. No lymphadenopathy, nodules, masses.  NEURO/ PSYCH:  No neurologic signs such as weakness numbness. No depression anxiety. IMM/ Allergy: No unusual infections.  Allergy .   REST of 12 system review negative except as per HPI   Past Medical History:  Diagnosis Date  . Agatston coronary artery calcium score less than 100 2013  . Anxiety   . Environmental allergies   . GERD (gastroesophageal reflux disease)    pt hew symptoms, difficulty swallowing, lump in throat  . Gynecomastia 03/01/2012  .  Headache(784.0)   . History of syncope    with low bp orthostatic onset hs  . Hx of colonic polyps    5 year recall history of rectal bleeding  . Hx of skin cancer, basal cell   . Hyperlipidemia   . Internal hemorrhoids   . Migraine   . Urinary incontinence    related to childbirth urge and stress [name] helps    Past Surgical History:  Procedure Laterality Date  . CESAREAN SECTION    . DESTRUCTION OF LESION ANAL    . DILATION AND CURETTAGE OF UTERUS     for miscarrage  . REDUCTION MAMMAPLASTY  2013    Family History  Problem Relation Age of Onset  . Hyperlipidemia Mother   . Depression Father   . Hypertension Father   . Other Father     tranverse mylitis  . Colon polyps Father   . Depression Brother   . Hypertension Brother   . Depression Daughter   . Anxiety disorder Daughter   . Allergies      Children  . Colon polyps Paternal Grandmother   . Colon cancer Neg Hx     Social History   Social History  . Marital status: Married    Spouse name: N/A  . Number of children: N/A  . Years of education: N/A   Social History Main Topics  . Smoking status:  Never Smoker  . Smokeless tobacco: Never Used  . Alcohol use 4.2 oz/week    7 Glasses of wine per week  . Drug use: No  . Sexual activity: Not Asked   Other Topics Concern  . None   Social History Narrative   Occupation: Programmer, applications, Masters level education, Travels 4 days per week   Married   Regular exercise- yes   HH of 4   No pets   Recently moved from [place]   1 glass wine per night    Outpatient Medications Prior to Visit  Medication Sig Dispense Refill  . ALPRAZolam (XANAX) 0.25 MG tablet Take 1 tablet (0.25 mg total) by mouth 3 (three) times daily as needed for sleep. For panic attack 30 tablet 0  . LEXAPRO 10 MG tablet Daily. Takes 1/2 to a whole tablet daily     No facility-administered medications prior to visit.      EXAM:  BP 106/72 (BP Location: Right Arm, Patient  Position: Sitting, Cuff Size: Normal)   Temp 98.4 F (36.9 C) (Oral)   Ht 5' 5.5" (1.664 m)   Wt 162 lb (73.5 kg)   BMI 26.55 kg/m   Body mass index is 26.55 kg/m.  Physical Exam: Vital signs reviewed GEX:BMWU is a well-developed well-nourished alert cooperative    who appearsr stated age in no acute distress.  HEENT: normocephalic atraumatic , Eyes: PERRL EOM's full, conjunctiva clear, Nares: paten,t no deformity discharge or tenderness., Ears: no deformity EAC's clear TMs with normal landmarks. Mouth: clear OP, no lesions, edema.  Moist mucous membranes. Dentition in adequate repair. NECK: supple without masses, thyromegaly or bruits. CHEST/PULM:  Clear to auscultation and percussion breath sounds equal no wheeze , rales or rhonchi. No chest wall deformities or tenderness. CV: PMI is nondisplaced, S1 S2 no gallops, murmurs, rubs. Peripheral pulses are full without delay.No JVD . Breast: normal by inspection . No dimpling, discharge, masses, tenderness or discharge . ABDOMEN: Bowel sounds normal nontender  No guard or rebound, no hepato splenomegal no CVA tenderness.  No hernia. Extremtities:  No clubbing cyanosis or edema, no acute joint swelling or redness no focal atrophy NEURO:  Oriented x3, cranial nerves 3-12 appear to be intact, no obvious focal weakness,gait within normal limits no abnormal reflexes or asymmetrical SKIN: No acute rashes normal turgor, color, no bruising or petechiae. PSYCH: Oriented, good eye contact, no obvious depression anxiety, cognition and judgment appear normal. LN: no cervical axillary inguinal adenopathy  Lab Results  Component Value Date   WBC 4.9 06/28/2016   HGB 12.9 06/28/2016   HCT 38.1 06/28/2016   PLT 342.0 06/28/2016   GLUCOSE 86 06/28/2016   CHOL 225 (H) 06/28/2016   TRIG 131.0 06/28/2016   HDL 57.80 06/28/2016   LDLDIRECT 139.3 09/04/2013   LDLCALC 141 (H) 06/28/2016   ALT 14 06/28/2016   AST 14 06/28/2016   NA 140 06/28/2016   K  4.2 06/28/2016   CL 105 06/28/2016   CREATININE 0.79 06/28/2016   BUN 9 06/28/2016   CO2 22 06/28/2016   TSH 1.37 06/28/2016    ASSESSMENT AND PLAN:  Discussed the following assessment and plan:  Visit for preventive health examination  Blood in stool - Plan: Fecal occult blood, imunochemical  Gastroesophageal reflux disease, esophagitis presence not specified Disc lipids also  See instruction s below  Patient Care Team: Burnis Medin, MD as PCP - General [place] Dragon, MD (Gastroenterology) Jarome Matin, MD (Dermatology) kortesis (  Plastic Surgery) Vanessa Kick, MD as Consulting Physician (Obstetrics and Gynecology) Patient Instructions  Try ranitidine  Twice a day for 2 weeks then  Wean and use as needed.  Continue lifestyle intervention healthy eating and exercise . Get stool test for blood  When not having diarrhea issues. If blood is present will have you see gi again  If having recurrent blood without  Cause  Will also advise let us know for rechecking . With GI .    Health Maintenance, Female Adopting a healthy lifestyle and getting preventive care can go a long way to promote health and wellness. Talk with your health care provider about what schedule of regular examinations is right for you. This is a good chance for you to check in with your provider about disease prevention and staying healthy. In between checkups, there are plenty of things you can do on your own. Experts have done a lot of research about which lifestyle changes and preventive measures are most likely to keep you healthy. Ask your health care provider for more information. WEIGHT AND DIET  Eat a healthy diet  Be sure to include plenty of vegetables, fruits, low-fat dairy products, and lean protein.  Do not eat a lot of foods high in solid fats, added sugars, or salt.  Get regular exercise. This is one of the most important things you can do for your health.  Most adults should exercise for at  least 150 minutes each week. The exercise should increase your heart rate and make you sweat (moderate-intensity exercise).  Most adults should also do strengthening exercises at least twice a week. This is in addition to the moderate-intensity exercise.  Maintain a healthy weight  Body mass index (BMI) is a measurement that can be used to identify possible weight problems. It estimates body fat based on height and weight. Your health care provider can help determine your BMI and help you achieve or maintain a healthy weight.  For females 45 years of age and older:   A BMI below 18.5 is considered underweight.  A BMI of 18.5 to 24.9 is normal.  A BMI of 25 to 29.9 is considered overweight.  A BMI of 30 and above is considered obese.  Watch levels of cholesterol and blood lipids  You should start having your blood tested for lipids and cholesterol at 48 years of age, then have this test every 5 years.  You may need to have your cholesterol levels checked more often if:  Your lipid or cholesterol levels are high.  You are older than 48 years of age.  You are at high risk for heart disease.  CANCER SCREENING   Lung Cancer  Lung cancer screening is recommended for adults 41-45 years old who are at high risk for lung cancer because of a history of smoking.  A yearly low-dose CT scan of the lungs is recommended for people who:  Currently smoke.  Have quit within the past 15 years.  Have at least a 30-pack-year history of smoking. A pack year is smoking an average of one pack of cigarettes a day for 1 year.  Yearly screening should continue until it has been 15 years since you quit.  Yearly screening should stop if you develop a health problem that would prevent you from having lung cancer treatment.  Breast Cancer  Practice breast self-awareness. This means understanding how your breasts normally appear and feel.  It also means doing regular breast self-exams. Let your  health  care provider know about any changes, no matter how small.  If you are in your 20s or 30s, you should have a clinical breast exam (CBE) by a health care provider every 1-3 years as part of a regular health exam.  If you are 74 or older, have a CBE every year. Also consider having a breast X-ray (mammogram) every year.  If you have a family history of breast cancer, talk to your health care provider about genetic screening.  If you are at high risk for breast cancer, talk to your health care provider about having an MRI and a mammogram every year.  Breast cancer gene (BRCA) assessment is recommended for women who have family members with BRCA-related cancers. BRCA-related cancers include:  Breast.  Ovarian.  Tubal.  Peritoneal cancers.  Results of the assessment will determine the need for genetic counseling and BRCA1 and BRCA2 testing. Cervical Cancer Your health care provider may recommend that you be screened regularly for cancer of the pelvic organs (ovaries, uterus, and vagina). This screening involves a pelvic examination, including checking for microscopic changes to the surface of your cervix (Pap test). You may be encouraged to have this screening done every 3 years, beginning at age 73.  For women ages 63-65, health care providers may recommend pelvic exams and Pap testing every 3 years, or they may recommend the Pap and pelvic exam, combined with testing for human papilloma virus (HPV), every 5 years. Some types of HPV increase your risk of cervical cancer. Testing for HPV may also be done on women of any age with unclear Pap test results.  Other health care providers may not recommend any screening for nonpregnant women who are considered low risk for pelvic cancer and who do not have symptoms. Ask your health care provider if a screening pelvic exam is right for you.  If you have had past treatment for cervical cancer or a condition that could lead to cancer, you need  Pap tests and screening for cancer for at least 20 years after your treatment. If Pap tests have been discontinued, your risk factors (such as having a new sexual partner) need to be reassessed to determine if screening should resume. Some women have medical problems that increase the chance of getting cervical cancer. In these cases, your health care provider may recommend more frequent screening and Pap tests. Colorectal Cancer  This type of cancer can be detected and often prevented.  Routine colorectal cancer screening usually begins at 48 years of age and continues through 48 years of age.  Your health care provider may recommend screening at an earlier age if you have risk factors for colon cancer.  Your health care provider may also recommend using home test kits to check for hidden blood in the stool.  A small camera at the end of a tube can be used to examine your colon directly (sigmoidoscopy or colonoscopy). This is done to check for the earliest forms of colorectal cancer.  Routine screening usually begins at age 47.  Direct examination of the colon should be repeated every 5-10 years through 48 years of age. However, you may need to be screened more often if early forms of precancerous polyps or small growths are found. Skin Cancer  Check your skin from head to toe regularly.  Tell your health care provider about any new moles or changes in moles, especially if there is a change in a mole's shape or color.  Also tell your health care provider  if you have a mole that is larger than the size of a pencil eraser.  Always use sunscreen. Apply sunscreen liberally and repeatedly throughout the day.  Protect yourself by wearing long sleeves, pants, a wide-brimmed hat, and sunglasses whenever you are outside. HEART DISEASE, DIABETES, AND HIGH BLOOD PRESSURE   High blood pressure causes heart disease and increases the risk of stroke. High blood pressure is more likely to develop  in:  People who have blood pressure in the high end of the normal range (130-139/85-89 mm Hg).  People who are overweight or obese.  People who are African American.  If you are 43-70 years of age, have your blood pressure checked every 3-5 years. If you are 38 years of age or older, have your blood pressure checked every year. You should have your blood pressure measured twice--once when you are at a hospital or clinic, and once when you are not at a hospital or clinic. Record the average of the two measurements. To check your blood pressure when you are not at a hospital or clinic, you can use:  An automated blood pressure machine at a pharmacy.  A home blood pressure monitor.  If you are between 68 years and 10 years old, ask your health care provider if you should take aspirin to prevent strokes.  Have regular diabetes screenings. This involves taking a blood sample to check your fasting blood sugar level.  If you are at a normal weight and have a low risk for diabetes, have this test once every three years after 48 years of age.  If you are overweight and have a high risk for diabetes, consider being tested at a younger age or more often. PREVENTING INFECTION  Hepatitis B  If you have a higher risk for hepatitis B, you should be screened for this virus. You are considered at high risk for hepatitis B if:  You were born in a country where hepatitis B is common. Ask your health care provider which countries are considered high risk.  Your parents were born in a high-risk country, and you have not been immunized against hepatitis B (hepatitis B vaccine).  You have HIV or AIDS.  You use needles to inject street drugs.  You live with someone who has hepatitis B.  You have had sex with someone who has hepatitis B.  You get hemodialysis treatment.  You take certain medicines for conditions, including cancer, organ transplantation, and autoimmune conditions. Hepatitis C  Blood  testing is recommended for:  Everyone born from [birth year] through 1963.  Anyone with known risk factors for hepatitis C. Sexually transmitted infections (STIs)  You should be screened for sexually transmitted infections (STIs) including gonorrhea and chlamydia if:  You are sexually active and are younger than 48 years of age.  You are older than 48 years of age and your health care provider tells you that you are at risk for this type of infection.  Your sexual activity has changed since you were last screened and you are at an increased risk for chlamydia or gonorrhea. Ask your health care provider if you are at risk.  If you do not have HIV, but are at risk, it may be recommended that you take a prescription medicine daily to prevent HIV infection. This is called pre-exposure prophylaxis (PrEP). You are considered at risk if:  You are sexually active and do not regularly use condoms or know the HIV status of your partner(s).  You take drugs by  injection.  You are sexually active with a partner who has HIV. Talk with your health care provider about whether you are at high risk of being infected with HIV. If you choose to begin PrEP, you should first be tested for HIV. You should then be tested every 3 months for as long as you are taking PrEP.  PREGNANCY   If you are premenopausal and you may become pregnant, ask your health care provider about preconception counseling.  If you may become pregnant, take 400 to 800 micrograms (mcg) of folic acid every day.  If you want to prevent pregnancy, talk to your health care provider about birth control (contraception). OSTEOPOROSIS AND MENOPAUSE   Osteoporosis is a disease in which the bones lose minerals and strength with aging. This can result in serious bone fractures. Your risk for osteoporosis can be identified using a bone density scan.  If you are 97 years of age or older, or if you are at risk for osteoporosis and fractures, ask your  health care provider if you should be screened.  Ask your health care provider whether you should take a calcium or vitamin D supplement to lower your risk for osteoporosis.  Menopause may have certain physical symptoms and risks.  Hormone replacement therapy may reduce some of these symptoms and risks. Talk to your health care provider about whether hormone replacement therapy is right for you.  HOME CARE INSTRUCTIONS   Schedule regular health, dental, and eye exams.  Stay current with your immunizations.   Do not use any tobacco products including cigarettes, chewing tobacco, or electronic cigarettes.  If you are pregnant, do not drink alcohol.  If you are breastfeeding, limit how much and how often you drink alcohol.  Limit alcohol intake to no more than 1 drink per day for nonpregnant women. One drink equals 12 ounces of beer, 5 ounces of wine, or 1 ounces of hard liquor.  Do not use street drugs.  Do not share needles.  Ask your health care provider for help if you need support or information about quitting drugs.  Tell your health care provider if you often feel depressed.  Tell your health care provider if you have ever been abused or do not feel safe at home.   This information is not intended to replace advice given to you by your health care provider. Make sure you discuss any questions you have with your health care provider.   Document Released: 05/29/2011 Document Revised: 12/04/2014 Document Reviewed: 10/15/2013 Elsevier Interactive Patient Education 2016 [place]. Arya Luttrull M.D.

## 2016-07-05 ENCOUNTER — Encounter: Payer: Self-pay | Admitting: Internal Medicine

## 2016-07-05 ENCOUNTER — Ambulatory Visit (INDEPENDENT_AMBULATORY_CARE_PROVIDER_SITE_OTHER): Payer: 59 | Admitting: Internal Medicine

## 2016-07-05 VITALS — BP 106/72 | Temp 98.4°F | Ht 65.5 in | Wt 162.0 lb

## 2016-07-05 DIAGNOSIS — K921 Melena: Secondary | ICD-10-CM

## 2016-07-05 DIAGNOSIS — K219 Gastro-esophageal reflux disease without esophagitis: Secondary | ICD-10-CM | POA: Diagnosis not present

## 2016-07-05 DIAGNOSIS — Z0000 Encounter for general adult medical examination without abnormal findings: Secondary | ICD-10-CM

## 2016-07-05 NOTE — Patient Instructions (Signed)
Try ranitidine  Twice a day for 2 weeks then  Wean and use as needed.  Continue lifestyle intervention healthy eating and exercise . Get stool test for blood  When not having diarrhea issues. If blood is present will have you see gi again  If having recurrent blood without  Cause  Will also advise let us know for rechecking . With GI .    Health Maintenance, Female Adopting a healthy lifestyle and getting preventive care can go a long way to promote health and wellness. Talk with your health care provider about what schedule of regular examinations is right for you. This is a good chance for you to check in with your provider about disease prevention and staying healthy. In between checkups, there are plenty of things you can do on your own. Experts have done a lot of research about which lifestyle changes and preventive measures are most likely to keep you healthy. Ask your health care provider for more information. WEIGHT AND DIET  Eat a healthy diet  Be sure to include plenty of vegetables, fruits, low-fat dairy products, and lean protein.  Do not eat a lot of foods high in solid fats, added sugars, or salt.  Get regular exercise. This is one of the most important things you can do for your health.  Most adults should exercise for at least 150 minutes each week. The exercise should increase your heart rate and make you sweat (moderate-intensity exercise).  Most adults should also do strengthening exercises at least twice a week. This is in addition to the moderate-intensity exercise.  Maintain a healthy weight  Body mass index (BMI) is a measurement that can be used to identify possible weight problems. It estimates body fat based on height and weight. Your health care provider can help determine your BMI and help you achieve or maintain a healthy weight.  For females 53 years of age and older:   A BMI below 18.5 is considered underweight.  A BMI of 18.5 to 24.9 is normal.  A BMI  of 25 to 29.9 is considered overweight.  A BMI of 30 and above is considered obese.  Watch levels of cholesterol and blood lipids  You should start having your blood tested for lipids and cholesterol at 48 years of age, then have this test every 5 years.  You may need to have your cholesterol levels checked more often if:  Your lipid or cholesterol levels are high.  You are older than 48 years of age.  You are at high risk for heart disease.  CANCER SCREENING   Lung Cancer  Lung cancer screening is recommended for adults 39-80 years old who are at high risk for lung cancer because of a history of smoking.  A yearly low-dose CT scan of the lungs is recommended for people who:  Currently smoke.  Have quit within the past 15 years.  Have at least a 30-pack-year history of smoking. A pack year is smoking an average of one pack of cigarettes a day for 1 year.  Yearly screening should continue until it has been 15 years since you quit.  Yearly screening should stop if you develop a health problem that would prevent you from having lung cancer treatment.  Breast Cancer  Practice breast self-awareness. This means understanding how your breasts normally appear and feel.  It also means doing regular breast self-exams. Let your health care provider know about any changes, no matter how small.  If you are in  your 20s or 30s, you should have a clinical breast exam (CBE) by a health care provider every 1-3 years as part of a regular health exam.  If you are 73 or older, have a CBE every year. Also consider having a breast X-ray (mammogram) every year.  If you have a family history of breast cancer, talk to your health care provider about genetic screening.  If you are at high risk for breast cancer, talk to your health care provider about having an MRI and a mammogram every year.  Breast cancer gene (BRCA) assessment is recommended for women who have family members with  BRCA-related cancers. BRCA-related cancers include:  Breast.  Ovarian.  Tubal.  Peritoneal cancers.  Results of the assessment will determine the need for genetic counseling and BRCA1 and BRCA2 testing. Cervical Cancer Your health care provider may recommend that you be screened regularly for cancer of the pelvic organs (ovaries, uterus, and vagina). This screening involves a pelvic examination, including checking for microscopic changes to the surface of your cervix (Pap test). You may be encouraged to have this screening done every 3 years, beginning at age 45.  For women ages 55-65, health care providers may recommend pelvic exams and Pap testing every 3 years, or they may recommend the Pap and pelvic exam, combined with testing for human papilloma virus (HPV), every 5 years. Some types of HPV increase your risk of cervical cancer. Testing for HPV may also be done on women of any age with unclear Pap test results.  Other health care providers may not recommend any screening for nonpregnant women who are considered low risk for pelvic cancer and who do not have symptoms. Ask your health care provider if a screening pelvic exam is right for you.  If you have had past treatment for cervical cancer or a condition that could lead to cancer, you need Pap tests and screening for cancer for at least 20 years after your treatment. If Pap tests have been discontinued, your risk factors (such as having a new sexual partner) need to be reassessed to determine if screening should resume. Some women have medical problems that increase the chance of getting cervical cancer. In these cases, your health care provider may recommend more frequent screening and Pap tests. Colorectal Cancer  This type of cancer can be detected and often prevented.  Routine colorectal cancer screening usually begins at 48 years of age and continues through 48 years of age.  Your health care provider may recommend screening at  an earlier age if you have risk factors for colon cancer.  Your health care provider may also recommend using home test kits to check for hidden blood in the stool.  A small camera at the end of a tube can be used to examine your colon directly (sigmoidoscopy or colonoscopy). This is done to check for the earliest forms of colorectal cancer.  Routine screening usually begins at age 78.  Direct examination of the colon should be repeated every 5-10 years through 48 years of age. However, you may need to be screened more often if early forms of precancerous polyps or small growths are found. Skin Cancer  Check your skin from head to toe regularly.  Tell your health care provider about any new moles or changes in moles, especially if there is a change in a mole's shape or color.  Also tell your health care provider if you have a mole that is larger than the size of a pencil  eraser.  Always use sunscreen. Apply sunscreen liberally and repeatedly throughout the day.  Protect yourself by wearing long sleeves, pants, a wide-brimmed hat, and sunglasses whenever you are outside. HEART DISEASE, DIABETES, AND HIGH BLOOD PRESSURE   High blood pressure causes heart disease and increases the risk of stroke. High blood pressure is more likely to develop in:  People who have blood pressure in the high end of the normal range (130-139/85-89 mm Hg).  People who are overweight or obese.  People who are African American.  If you are 18-39 years of age, have your blood pressure checked every 3-5 years. If you are 40 years of age or older, have your blood pressure checked every year. You should have your blood pressure measured twice--once when you are at a hospital or clinic, and once when you are not at a hospital or clinic. Record the average of the two measurements. To check your blood pressure when you are not at a hospital or clinic, you can use:  An automated blood pressure machine at a  pharmacy.  A home blood pressure monitor.  If you are between 55 years and 79 years old, ask your health care provider if you should take aspirin to prevent strokes.  Have regular diabetes screenings. This involves taking a blood sample to check your fasting blood sugar level.  If you are at a normal weight and have a low risk for diabetes, have this test once every three years after 48 years of age.  If you are overweight and have a high risk for diabetes, consider being tested at a younger age or more often. PREVENTING INFECTION  Hepatitis B  If you have a higher risk for hepatitis B, you should be screened for this virus. You are considered at high risk for hepatitis B if:  You were born in a country where hepatitis B is common. Ask your health care provider which countries are considered high risk.  Your parents were born in a high-risk country, and you have not been immunized against hepatitis B (hepatitis B vaccine).  You have HIV or AIDS.  You use needles to inject street drugs.  You live with someone who has hepatitis B.  You have had sex with someone who has hepatitis B.  You get hemodialysis treatment.  You take certain medicines for conditions, including cancer, organ transplantation, and autoimmune conditions. Hepatitis C  Blood testing is recommended for:  Everyone born from 1945 through 1965.  Anyone with known risk factors for hepatitis C. Sexually transmitted infections (STIs)  You should be screened for sexually transmitted infections (STIs) including gonorrhea and chlamydia if:  You are sexually active and are younger than 48 years of age.  You are older than 48 years of age and your health care provider tells you that you are at risk for this type of infection.  Your sexual activity has changed since you were last screened and you are at an increased risk for chlamydia or gonorrhea. Ask your health care provider if you are at risk.  If you do not  have HIV, but are at risk, it may be recommended that you take a prescription medicine daily to prevent HIV infection. This is called pre-exposure prophylaxis (PrEP). You are considered at risk if:  You are sexually active and do not regularly use condoms or know the HIV status of your partner(s).  You take drugs by injection.  You are sexually active with a partner who has HIV. Talk with   your health care provider about whether you are at high risk of being infected with HIV. If you choose to begin PrEP, you should first be tested for HIV. You should then be tested every 3 months for as long as you are taking PrEP.  PREGNANCY   If you are premenopausal and you may become pregnant, ask your health care provider about preconception counseling.  If you may become pregnant, take 400 to 800 micrograms (mcg) of folic acid every day.  If you want to prevent pregnancy, talk to your health care provider about birth control (contraception). OSTEOPOROSIS AND MENOPAUSE   Osteoporosis is a disease in which the bones lose minerals and strength with aging. This can result in serious bone fractures. Your risk for osteoporosis can be identified using a bone density scan.  If you are 65 years of age or older, or if you are at risk for osteoporosis and fractures, ask your health care provider if you should be screened.  Ask your health care provider whether you should take a calcium or vitamin D supplement to lower your risk for osteoporosis.  Menopause may have certain physical symptoms and risks.  Hormone replacement therapy may reduce some of these symptoms and risks. Talk to your health care provider about whether hormone replacement therapy is right for you.  HOME CARE INSTRUCTIONS   Schedule regular health, dental, and eye exams.  Stay current with your immunizations.   Do not use any tobacco products including cigarettes, chewing tobacco, or electronic cigarettes.  If you are pregnant, do not  drink alcohol.  If you are breastfeeding, limit how much and how often you drink alcohol.  Limit alcohol intake to no more than 1 drink per day for nonpregnant women. One drink equals 12 ounces of beer, 5 ounces of wine, or 1 ounces of hard liquor.  Do not use street drugs.  Do not share needles.  Ask your health care provider for help if you need support or information about quitting drugs.  Tell your health care provider if you often feel depressed.  Tell your health care provider if you have ever been abused or do not feel safe at home.   This information is not intended to replace advice given to you by your health care provider. Make sure you discuss any questions you have with your health care provider.   Document Released: 05/29/2011 Document Revised: 12/04/2014 Document Reviewed: 10/15/2013 Elsevier Interactive Patient Education 2016 Elsevier Inc.  

## 2016-08-21 ENCOUNTER — Ambulatory Visit (INDEPENDENT_AMBULATORY_CARE_PROVIDER_SITE_OTHER)
Admission: RE | Admit: 2016-08-21 | Discharge: 2016-08-21 | Disposition: A | Discharge status: Home / Self Care | Payer: 59 | Source: Ambulatory Visit | Attending: Internal Medicine | Admitting: Internal Medicine

## 2016-08-21 ENCOUNTER — Encounter: Payer: Self-pay | Admitting: Internal Medicine

## 2016-08-21 ENCOUNTER — Ambulatory Visit (INDEPENDENT_AMBULATORY_CARE_PROVIDER_SITE_OTHER): Payer: 59 | Admitting: Internal Medicine

## 2016-08-21 VITALS — BP 106/76 | Temp 98.2°F | Wt 157.4 lb

## 2016-08-21 DIAGNOSIS — M542 Cervicalgia: Secondary | ICD-10-CM

## 2016-08-21 DIAGNOSIS — M436 Torticollis: Secondary | ICD-10-CM | POA: Diagnosis not present

## 2016-08-21 MED ORDER — CYCLOBENZAPRINE HCL 5 MG PO TABS: 5.0000 mg | ORAL_TABLET | Freq: Every evening | ORAL | 0 refills | Status: DC | PRN

## 2016-08-21 NOTE — Progress Notes (Signed)
Pre visit review using our clinic review tool, if applicable. No additional management support is needed unless otherwise documented below in the visit note.  Chief Complaint  Patient presents with  . Neck Pain    X15 Days.  Having headaches by end of the day.    HPI: Tamara Watkins 50 y.o.  sda   appt   15 days of  Stiff neck and 600 mg every 6 hours   ibu    .    Min better  Awoke with this .   Had had massages    And muscles  [place]ores and  [place]ley  horses and sciatica.    Remained the same  .   Now can flex front back but not side to side     Yoga no help on one day . Icing and hear upper back .  Slight better in am worse as day goes on  Sometimes throbbing but not in head and no neuro sx .   ROS: See pertinent positives and negatives per HPI. No numbness weakness  Injury note   Past Medical History:  Diagnosis Date  . Agatston coronary artery calcium score less than 100 2013  . Anxiety   . Environmental allergies   . GERD (gastroesophageal reflux disease)    pt hew symptoms, difficulty swallowing, lump in throat  . Gynecomastia 03/01/2012  . Headache(784.0)   . History of syncope    with low bp orthostatic onset hs  . Hx of colonic polyps    5 year recall history of rectal bleeding  . Hx of skin cancer, basal cell   . Hyperlipidemia   . Internal hemorrhoids   . Migraine   . Urinary incontinence    related to childbirth urge and stress [name] helps    Family History  Problem Relation Age of Onset  . Hyperlipidemia Mother   . Depression Father   . Hypertension Father   . Other Father     tranverse mylitis  . Colon polyps Father   . Depression Brother   . Hypertension Brother   . Depression Daughter   . Anxiety disorder Daughter   . Allergies      Children  . Colon polyps Paternal Grandmother   . Colon cancer Neg Hx     Social History   Social History  . Marital status: Married    Spouse name: N/A  . Number of children: N/A  . Years of education: N/A   Social  History Main Topics  . Smoking status: Never Smoker  . Smokeless tobacco: Never Used  . Alcohol use 4.2 oz/week    7 Glasses of wine per week  . Drug use: No  . Sexual activity: Not Asked   Other Topics Concern  . None   Social History Narrative   Occupation: Programmer, applications, Masters level education, Travels 4 days per week   Married   Regular exercise- yes   HH of 4   No pets   Recently moved from [place]   1 glass wine per night    Outpatient Medications Prior to Visit  Medication Sig Dispense Refill  . ALPRAZolam (XANAX) 0.25 MG tablet Take 1 tablet (0.25 mg total) by mouth 3 (three) times daily as needed for sleep. For panic attack 30 tablet 0  . LEXAPRO 10 MG tablet Daily. Takes 1/2 to a whole tablet daily    . ranitidine (ZANTAC) 150 MG tablet Take 150 mg by mouth 2 (two)  times daily.     No facility-administered medications prior to visit.      EXAM:  BP 106/76 (BP Location: Right Arm, Patient Position: Sitting, Cuff Size: Normal)   Temp 98.2 F (36.8 C) (Oral)   Wt 157 lb 6.4 oz (71.4 kg)   LMP 08/15/2016   BMI 25.79 kg/m   Body mass index is 25.79 kg/m.  GENERAL: vitals reviewed and listed above, alert, oriented, appears well hydrated and in no acute distress holds head slightly stiff  HEENT: atraumatic, conjunctiva  clear, no obvious abnormalities on inspection of external nose and ears tm OP : no lesion edema or exudate  NECK: no obvious masses on inspection palpation   No burit  No mid line tenderness  Upper lateral m  Area  Tender   Neg sprurlings  rom dec lateral flexion from discomfort stiffness  LUNGS: clear to auscultation bilaterally, no wheezes, rales or rhonchi,  CV: HRRR, no clubbing cyanosis or  peripheral edema nl cap refill  MS: moves all extremities without noticeable focal  Abnormality NEURO: oriented x 3 CN 3-12 appear intact. No focal muscle weakness or atrophy.noted  DTRs symmetrical. Gait WNL.  Grossly non focal. No tremor or  abnormal movement. PSYCH: pleasant and cooperative, no obvious depression or anxiety  ASSESSMENT AND PLAN:  Discussed the following assessment and plan:  Neck pain - Plan: Ambulatory referral to Physical Therapy  acute neck pain - Plan: DG Cervical Spine Complete  Neck stiffness - seems mechanichal but persitent 15 days failed conservative methods  - Plan: Ambulatory referral to Physical Therapy  Risk benefit of medication discussed. Plan fu depndign on how doing -Patient advised to return or notify health care team  if symptoms worsen ,persist or new concerns arise.  Patient Instructions  This acts like   mechanical neck  pain .  But I  agree that should be getting better   Your exam is reassuring .    X ray  And then PT . To see if helps.  Can try  Short term  Night muscle   relaxant  In the interim    Acute Torticollis Torticollis is a condition in which the muscles of the neck tighten (contract) abnormally, causing the neck to twist and the head to move into an unnatural position. Torticollis that develops suddenly is called acute torticollis. If torticollis becomes chronic and is left untreated, the face and neck can become deformed. CAUSES This condition may be caused by:  Sleeping in an awkward position (common).  Extending or twisting the neck muscles beyond their normal position.  Infection. In some cases, the cause may not be known. SYMPTOMS Symptoms of this condition include:  An unnatural position of the head.  Neck pain.  A limited ability to move the neck.  Twisting of the neck to one side. DIAGNOSIS This condition is diagnosed with a physical exam. You may also have imaging tests, such as an X-ray, CT scan, or MRI. TREATMENT Treatment for this condition involves trying to relax the neck muscles. It may include:  Medicines or shots.  Physical therapy.  Surgery. This may be done in severe cases. HOME CARE INSTRUCTIONS  Take medicines only as  directed by your health care provider.  Do stretching exercises and massage your neck as directed by your health care provider.  Keep all follow-up visits as directed by your health care provider. This is important. SEEK MEDICAL CARE IF:  You develop a fever. SEEK IMMEDIATE MEDICAL CARE IF:  You develop difficulty breathing.  You develop noisy breathing (stridor).  You start drooling.  You have trouble swallowing or have pain with swallowing.  You develop numbness or weakness in your hands or feet.  You have changes in your speech, understanding, or vision.  Your pain gets worse.   This information is not intended to replace advice given to you by your health care provider. Make sure you discuss any questions you have with your health care provider.   Document Released: 11/10/2000 Document Revised: 03/30/2015 Document Reviewed: 11/09/2014 Elsevier Interactive Patient Education 2016 Trafford K. Panosh M.D.

## 2016-08-21 NOTE — Patient Instructions (Addendum)
This acts like   mechanical neck  pain .  But I  agree that should be getting better   Your exam is reassuring .    X ray  And then PT . To see if helps.  Can try  Short term  Night muscle   relaxant  In the interim    Acute Torticollis Torticollis is a condition in which the muscles of the neck tighten (contract) abnormally, causing the neck to twist and the head to move into an unnatural position. Torticollis that develops suddenly is called acute torticollis. If torticollis becomes chronic and is left untreated, the face and neck can become deformed. CAUSES This condition may be caused by:  Sleeping in an awkward position (common).  Extending or twisting the neck muscles beyond their normal position.  Infection. In some cases, the cause may not be known. SYMPTOMS Symptoms of this condition include:  An unnatural position of the head.  Neck pain.  A limited ability to move the neck.  Twisting of the neck to one side. DIAGNOSIS This condition is diagnosed with a physical exam. You may also have imaging tests, such as an X-ray, CT scan, or MRI. TREATMENT Treatment for this condition involves trying to relax the neck muscles. It may include:  Medicines or shots.  Physical therapy.  Surgery. This may be done in severe cases. HOME CARE INSTRUCTIONS  Take medicines only as directed by your health care provider.  Do stretching exercises and massage your neck as directed by your health care provider.  Keep all follow-up visits as directed by your health care provider. This is important. SEEK MEDICAL CARE IF:  You develop a fever. SEEK IMMEDIATE MEDICAL CARE IF:  You develop difficulty breathing.  You develop noisy breathing (stridor).  You start drooling.  You have trouble swallowing or have pain with swallowing.  You develop numbness or weakness in your hands or feet.  You have changes in your speech, understanding, or vision.  Your pain gets worse.   This  information is not intended to replace advice given to you by your health care provider. Make sure you discuss any questions you have with your health care provider.   Document Released: 11/10/2000 Document Revised: 03/30/2015 Document Reviewed: 11/09/2014 Elsevier Interactive Patient Education [employer].

## 2016-08-31 ENCOUNTER — Ambulatory Visit: Payer: 59 | Attending: Internal Medicine | Admitting: Physical Therapy

## 2016-08-31 DIAGNOSIS — R293 Abnormal posture: Secondary | ICD-10-CM | POA: Insufficient documentation

## 2016-08-31 DIAGNOSIS — M542 Cervicalgia: Secondary | ICD-10-CM | POA: Diagnosis not present

## 2016-08-31 DIAGNOSIS — R252 Cramp and spasm: Secondary | ICD-10-CM | POA: Diagnosis present

## 2016-08-31 NOTE — Therapy (Signed)
[place], [place], [address] Phone: [phone]   Fax:  [phone]  Physical Therapy Evaluation  Patient Details  Name: Tamara Watkins MRN: [id number] Date of Birth: 1966/05/11 Referring Provider: Shanon Ace MD  Encounter Date: 08/31/2016      PT End of Session - 08/31/16 0813    Visit Number 1   Number of Visits 12   Date for PT Re-Evaluation 10/12/16   Authorization Type UMR   Authorization Time Period 11_16-17   PT Start Time 0800   PT Stop Time 0855   PT Time Calculation (min) 55 min   Activity Tolerance Patient tolerated treatment well   Behavior During Therapy [facility] for tasks assessed/performed      Past Medical History:  Diagnosis Date  . Agatston coronary artery calcium score less than 100 2013  . Anxiety   . Environmental allergies   . GERD (gastroesophageal reflux disease)    pt hew symptoms, difficulty swallowing, lump in throat  . Gynecomastia 03/01/2012  . Headache(784.0)   . History of syncope    with low bp orthostatic onset hs  . Hx of colonic polyps    5 year recall history of rectal bleeding  . Hx of skin cancer, basal cell   . Hyperlipidemia   . Internal hemorrhoids   . Migraine   . Urinary incontinence    related to childbirth urge and stress [name] helps    Past Surgical History:  Procedure Laterality Date  . CESAREAN SECTION    . DESTRUCTION OF LESION ANAL    . DILATION AND CURETTAGE OF UTERUS     for miscarrage  . REDUCTION MAMMAPLASTY  2013    There were no vitals filed for this visit.       Subjective Assessment - 08/31/16 0809    Subjective I woke up with a 'crick" in my neck.  It has been hurting to turn my neck and drive and any time I try to turn my neck. I tend to carry my stress in my left neck.    Pertinent History cervical lymphadema,    Limitations Lifting;House hold activities  whenever I lie down or turn my neck   How long can you sit  comfortably? unlimited  but just cant turn neck   How long can you stand comfortably? unlimited but just cant turn neck   How long can you walk comfortably? unlimited but just cant turn or sleep    Diagnostic tests xray   Patient Stated Goals to relieve pain and be able to turn neck   Currently in Pain? Yes   Pain Score 5   7 at end of day    Pain Location Neck   Pain Orientation Right;Left   Pain Descriptors / Indicators Throbbing;Aching   Pain Type Chronic pain   Pain Onset 1 to 4 weeks ago  one month ago   Aggravating Factors  lying on a pillow, turning my neck   Pain Relieving Factors medication, heat and ice            [facility] PT Assessment - 08/31/16 0814      Assessment   Medical Diagnosis neck pain and stiffness   Referring Provider Shanon Ace MD   Onset Date/Surgical Date 08/01/16   Hand Dominance Right   Prior Therapy none     Precautions   Precautions None     Restrictions   Weight Bearing Restrictions No     Balance Screen  Has the patient fallen in the past 6 months No   Has the patient had a decrease in activity level because of a fear of falling?  No   Is the patient reluctant to leave their home because of a fear of falling?  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   Leisure read and walk but not for exericise     Cognition   Overall Cognitive Status Within Functional Limits for tasks assessed     Observation/Other Assessments   Focus on Therapeutic Outcomes (FOTO)  FOTO intake 59%, limitation 41% predicted 30%     Sensation   Light Touch Appears Intact     Posture/Postural  Control   Posture/Postural Control Postural limitations   Postural Limitations Rounded Shoulders;Forward head;Anterior pelvic tilt   Posture Comments slight forward head     AROM   Cervical Flexion 70   Cervical Extension 60   Cervical - Right Side Bend 18  ERP   Cervical - Left Side Bend 22  ERP   Cervical - Right Rotation 45  ERP   Cervical - Left Rotation 40  ERP     Strength   Overall Strength Within functional limits for tasks performed   Right Shoulder Flexion 5/5   Right Shoulder ABduction 5/5   Left Shoulder Flexion 5/5   Left Shoulder ABduction 5/5     Palpation   Palpation comment moderate                   [facility] Adult PT Treatment/Exercise - 08/31/16 0814      Self-Care   Self-Care Posture   Posture initial education on posture sitting and standing     Modalities   Modalities Moist Heat     Moist Heat Therapy   Number Minutes Moist Heat 15 Minutes   Moist Heat Location Cervical     Manual Therapy   Manual Therapy Soft tissue mobilization;Joint mobilization   Joint Mobilization C-3 to C-6 PA grade 3/4   Soft tissue mobilization soft tissue for bil cervical paraspinals. Upper trap and levator bil     Neck Exercises: Stretches   Upper Trapezius Stretch 2 reps;30 seconds   Upper Trapezius Stretch Limitations bil VC   Levator Stretch 2 reps;30 seconds   Levator Stretch Limitations bil VC          Trigger Point Dry Needling - 08/31/16 0831    Consent Given? Yes   Education Handout Provided Yes   Muscles Treated Upper Body Levator scapulae;Upper trapezius  bil   Upper Trapezius Response Twitch reponse elicited;Palpable increased muscle length   Levator Scapulae Response Twitch response elicited;Palpable increased muscle length              PT Education - 08/31/16 0829    Education provided Yes   Education Details POC, Explanation of findings  posture, trigger dry needling and aftercare and precautians and initial neck stretches    Person(s) Educated Patient   Methods Explanation;Demonstration;Verbal cues;Handout   Comprehension Verbalized understanding;Returned demonstration             PT Long Term  Goals - 08/31/16 1325      PT LONG TERM GOAL #1   Title "Demonstrate and verbalize techniques to reduce the risk of re-injury including: lifting, posture, body mechanics.    Time 6   Period Weeks   Status New     PT LONG TERM GOAL #2   Title "Pt will be independent with advanced HEP.    Time 6   Period Weeks   Status New     PT LONG TERM GOAL #3   Title "Pt will not wake due to pain  while turning in bed while sleeping at night   Time 6   Period Weeks   Status New     PT LONG TERM GOAL #4   Title "FOTO will improve from  41% limitation  to  30% limitation   indicating improved functional mobility   Time 6   Period Weeks     PT LONG TERM GOAL #5   Title Pt will be able to drive and turn neck without exacerbating pain.   Time 6   Period Weeks   Status New               Plan - 08/31/16 1319    Clinical Impression Statement 50 yo presents with bil neck spasm and pain for about a month.  She report she has pain for about a month when she woke up with a 'crick " in her neck and she has had difficulty sleeping, and turning neck ever since .  She has deficits in AROM, Abnormal posture with slight forward head, pain  and muscle spasms in bil cervical paraspinals.  Pt will benefit from skilled PT to address deficits and return to PLOF with no pain.   Rehab Potential Excellent   PT Frequency 2x / week   PT Duration 6 weeks   PT Treatment/Interventions Cryotherapy;Electrical Stimulation;Iontophoresis 4mg /ml Dexamethasone;Moist Heat;Traction;Patient/family education;Therapeutic exercise;Manual techniques;Passive range of motion;Taping;Dry needling   PT Next Visit Plan REview HEP initiall , Assess trigger point dry needling   PT Home Exercise Plan neck stretches, and posture initial teaching    Consulted and Agree with Plan of Care Patient      Patient will benefit from skilled therapeutic intervention in order to improve the following deficits and impairments:  Postural dysfunction, Pain, Decreased range of motion, Increased muscle spasms, Improper body mechanics  Visit Diagnosis: Cervicalgia  Abnormal posture  Cramp and spasm     Problem List Patient Active Problem List   Diagnosis Date Noted  . Breakthrough bleeding mid cycle  01/27/2015  . Irregular menstrual cycle 06/08/2014  . Weight gain 06/08/2014  . Hoarseness 06/08/2014  . Sore throat 06/08/2014  . Sleep disturbance 06/08/2014  . Abnormal skin color 10/30/2012  . Agatston coronary artery calcium score less than 100   . Neck pain, chronic 03/01/2012  . Chronic upper back pain 03/01/2012  . Allergic rhinitis, seasonal 03/01/2012  . Cervicogenic migraine 03/01/2012  . Encounter for preventive health examination 05/22/2011  . Goiter 04/28/2011  . POSTNASAL DRIP SYNDROME 08/05/2010  . CERVICAL LYMPHADENOPATHY, RIGHT 08/05/2010  . FATIGUE 04/09/2009  . Hyperlipidemia 03/09/2009  . PANIC DISORDER 03/09/2009  . URINARY INCONTINENCE 03/09/2009  . SKIN CANCER, HX OF 03/09/2009  . COLONIC POLYPS, HX OF 03/09/2009   Voncille Lo, PT Exercise Expert for the Aging Adult  08/31/16 1:29 PM Phone: [phone] Fax: [facility] [address] [place], [place], [address] Phone: [phone]   Fax:  [phone]  Name: Tamara Watkins MRN: [id number] Date of Birth: 12/20/1965

## 2016-08-31 NOTE — Patient Instructions (Signed)
Trigger Point Dry Needling  . What is Trigger Point Dry Needling (DN)? o DN is a physical therapy technique used to treat muscle pain and dysfunction. Specifically, DN helps deactivate muscle trigger points (muscle knots).  o A thin filiform needle is used to penetrate the skin and stimulate the underlying trigger point. The goal is for a local twitch response (LTR) to occur and for the trigger point to relax. No medication of any kind is injected during the procedure.   . What Does Trigger Point Dry Needling Feel Like?  o The procedure feels different for each individual patient. Some patients report that they do not actually feel the needle enter the skin and overall the process is not painful. Very mild bleeding may occur. However, many patients feel a deep cramping in the muscle in which the needle was inserted. This is the local twitch response.   Marland Kitchen How Will I feel after the treatment? o Soreness is normal, and the onset of soreness may not occur for a few hours. Typically this soreness does not last longer than two days.  o Bruising is uncommon, however; ice can be used to decrease any possible bruising.  o In rare cases feeling tired or nauseous after the treatment is normal. In addition, your symptoms may get worse before they get better, this period will typically not last longer than 24 hours.   . What Can I do After My Treatment? o Increase your hydration by drinking more water for the next 24 hours. o You may place ice or heat on the areas treated that have become sore, however, do not use heat on inflamed or bruised areas. Heat often brings more relief post needling. o You can continue your regular activities, but vigorous activity is not recommended initially after the treatment for 24 hours. o DN is best combined with other physical therapy such as strengthening, stretching, and other therapies.    Levator Stretch   Grasp seat or sit on hand on side to be stretched. Turn head  toward other side and look down.  Look into your armpit. Do not force with hand Hold _30___ seconds. Repeat on other side. Repeat __2-3__ times. Do _2-3___ sessions per day.  http://gt2.exer.us/30   Copyright  VHI. All rights reserved.  Side-Bending   One hand on opposite side of head, pull head to side as far as is comfortable. Stop if there is pain. Hold 30____ seconds. Repeat with other hand to other side. Repeat __2-3__ times. Do _2-3___ sessions per day. Posture Tips DO: - stand tall and erect - keep chin tucked in - keep head and shoulders in alignment - check posture regularly in mirror or large window - pull head back against headrest in car seat;  Change your position often.  Sit with lumbar support. DON'T: - slouch or slump while watching TV or reading - sit, stand or lie in one position  for too long;  Sitting is especially hard on the spine so if you sit at a desk/use the computer, then stand up often!   Copyright  VHI. All rights reserved.  Posture - Standing   Good posture is important. Avoid slouching and forward head thrust. Maintain curve in low back and align ears over shoul- ders, hips over ankles.  Pull your belly button in toward your back bone. Stand with even weight in heels and toes. Ribs lifted up and chin down.  Not military [employer] or [employer]. All rights reserved.  Posture - Sitting   Sit upright, head facing forward. Try using a roll to support lower back. Keep shoulders relaxed, and avoid rounded back. Keep hips level with knees. Avoid crossing legs for long periods. Sit on sit bones not tail bone   Copyright  VHI. All rights reserved.

## 2016-09-07 ENCOUNTER — Ambulatory Visit: Payer: 59 | Admitting: Physical Therapy

## 2016-09-07 DIAGNOSIS — M542 Cervicalgia: Secondary | ICD-10-CM

## 2016-09-07 DIAGNOSIS — R293 Abnormal posture: Secondary | ICD-10-CM

## 2016-09-07 DIAGNOSIS — R252 Cramp and spasm: Secondary | ICD-10-CM

## 2016-09-07 NOTE — Therapy (Signed)
[place], [place], [address] Phone: [phone]   Fax:  [phone]  Physical Therapy Treatment  Patient Details  Name: Tamara Watkins MRN: [id number] Date of Birth: 10/09/1966 Referring Provider: Shanon Ace MD  Encounter Date: 09/07/2016      PT End of Session - 09/07/16 1234    Visit Number 2   Number of Visits 12   Date for PT Re-Evaluation 10/12/16   Authorization Type UMR   Authorization Time Period 11_16-17   PT Start Time 1151   PT Stop Time 1247   PT Time Calculation (min) 56 min   Activity Tolerance Patient tolerated treatment well   Behavior During Therapy [facility] for tasks assessed/performed      Past Medical History:  Diagnosis Date  . Agatston coronary artery calcium score less than 100 2013  . Anxiety   . Environmental allergies   . GERD (gastroesophageal reflux disease)    pt hew symptoms, difficulty swallowing, lump in throat  . Gynecomastia 03/01/2012  . Headache(784.0)   . History of syncope    with low bp orthostatic onset hs  . Hx of colonic polyps    5 year recall history of rectal bleeding  . Hx of skin cancer, basal cell   . Hyperlipidemia   . Internal hemorrhoids   . Migraine   . Urinary incontinence    related to childbirth urge and stress [name] helps    Past Surgical History:  Procedure Laterality Date  . CESAREAN SECTION    . DESTRUCTION OF LESION ANAL    . DILATION AND CURETTAGE OF UTERUS     for miscarrage  . REDUCTION MAMMAPLASTY  2013    There were no vitals filed for this visit.      Subjective Assessment - 09/07/16 1157    Subjective I felt better for about 3 days and then it started tightening up on Sunday. I felt the dry needling helped   Pertinent History cervical lymphadema,    Limitations Lifting;House hold activities   Diagnostic tests xray   Patient Stated Goals to relieve pain and be able to turn neck   Currently in Pain? Yes   Pain Score  5   7/10 at end of day   Pain Location Neck   Pain Descriptors / Indicators Throbbing;Aching   Pain Type Chronic pain   Pain Onset More than a month ago   Pain Frequency Constant            [facility] PT Assessment - 09/07/16 1236      AROM   Cervical - Right Rotation 45  no pain   Cervical - Left Rotation 45  no pain                     [facility] Adult PT Treatment/Exercise - 09/07/16 1235      Shoulder Exercises: Standing   External Rotation Strengthening;Both;10 reps;Theraband   Theraband Level (Shoulder External Rotation) Level 2 (Red)   Extension Strengthening;Both;10 reps;Theraband   Theraband Level (Shoulder Extension) Level 2 (Red)   Row Strengthening;Both;10 reps;Theraband   Theraband Level (Shoulder Row) Level 2 (Red)     Modalities   Modalities Moist Heat     Moist Heat Therapy   Number Minutes Moist Heat 15 Minutes   Moist Heat Location Cervical     Manual Therapy   Manual Therapy Soft tissue mobilization   Joint Mobilization C-3 to C-6 lateral PA grade 3/4, suboccipital release  Soft tissue mobilization soft tissue for bil cervical paraspinals. Upper trap and levator bil          Trigger Point Dry Needling - 09/07/16 1208    Consent Given? Yes   Education Handout Provided No  previously given   Muscles Treated Upper Body Levator scapulae;Upper trapezius;Suboccipitals muscle group  c-2 to C-6 bil twitch more onleft than right   Upper Trapezius Response Twitch reponse elicited;Palpable increased muscle length   SubOccipitals Response Twitch response elicited;Palpable increased muscle length   Levator Scapulae Response Twitch response elicited;Palpable increased muscle length              PT Education - 09/07/16 1201    Education provided Yes   Education Details Added to HEP Standing scapular stabilizers   Person(s) Educated Patient   Methods Explanation;Demonstration;Tactile cues;Verbal cues;Handout   Comprehension Verbalized  understanding;Returned demonstration             PT Long Term Goals - 09/07/16 1205      PT LONG TERM GOAL #1   Title "Demonstrate and verbalize techniques to reduce the risk of re-injury including: lifting, posture, body mechanics.    Time 6   Period Weeks   Status On-going     PT LONG TERM GOAL #2   Title "Pt will be independent with advanced HEP.    Time 6   Period Weeks   Status On-going     PT LONG TERM GOAL #3   Title "Pt will not wake due to pain  while turning in bed while sleeping at night   Time 6   Period Weeks   Status On-going     PT LONG TERM GOAL #4   Title "FOTO will improve from  41% limitation  to  30% limitation   indicating improved functional mobility   Time 6   Period Weeks   Status On-going     PT LONG TERM GOAL #5   Title Pt will be able to drive and turn neck without exacerbating pain.   Time 6   Period Weeks   Status On-going               Plan - 09/07/16 1235    Clinical Impression Statement Pt reported having less pain for 3 days post trigger point dry needling and increased AROM of rotation bil with no pain on end range.  Will continue toward completion of goals and progress exericiise as able.    Rehab Potential Excellent   PT Frequency 2x / week   PT Duration 6 weeks   PT Treatment/Interventions Cryotherapy;Electrical Stimulation;Iontophoresis 4mg /ml Dexamethasone;Moist Heat;Traction;Patient/family education;Therapeutic exercise;Manual techniques;Passive range of motion;Taping;Dry needling   PT Next Visit Plan review scapular stabilizers, add deep neck flexor exercises.    PT Home Exercise Plan neck stretches, and scapular stabilizers with red t band   Consulted and Agree with Plan of Care Patient      Patient will benefit from skilled therapeutic intervention in order to improve the following deficits and impairments:  Postural dysfunction, Pain, Decreased range of motion, Increased muscle spasms, Improper body  mechanics  Visit Diagnosis: Cervicalgia  Abnormal posture  Cramp and spasm     Problem List Patient Active Problem List   Diagnosis Date Noted  . Breakthrough bleeding mid cycle  01/27/2015  . Irregular menstrual cycle 06/08/2014  . Weight gain 06/08/2014  . Hoarseness 06/08/2014  . Sore throat 06/08/2014  . Sleep disturbance 06/08/2014  . Abnormal skin color 10/30/2012  . Agatston  coronary artery calcium score less than 100   . Neck pain, chronic 03/01/2012  . Chronic upper back pain 03/01/2012  . Allergic rhinitis, seasonal 03/01/2012  . Cervicogenic migraine 03/01/2012  . Encounter for preventive health examination 05/22/2011  . Goiter 04/28/2011  . POSTNASAL DRIP SYNDROME 08/05/2010  . CERVICAL LYMPHADENOPATHY, RIGHT 08/05/2010  . FATIGUE 04/09/2009  . Hyperlipidemia 03/09/2009  . PANIC DISORDER 03/09/2009  . URINARY INCONTINENCE 03/09/2009  . SKIN CANCER, HX OF 03/09/2009  . COLONIC POLYPS, HX OF 03/09/2009    Voncille Lo, PT Exercise Expert for the Aging Adult  09/07/16 12:40 PM Phone: [phone] Fax: [facility] [facility] [address] [place], [place], [address] Phone: [phone]   Fax:  [phone]  Name: Othelia Mi MRN: [id number] Date of Birth: 05/02/1966

## 2016-09-07 NOTE — Patient Instructions (Signed)
    Can do up to 20 x 1-2 x a day.  Voncille Lo, PT Exercise Expert for the Aging Adult  09/07/16 12:00 PM Phone: [phone] Fax: [phone]

## 2016-09-11 ENCOUNTER — Ambulatory Visit: Payer: 59 | Admitting: Physical Therapy

## 2016-09-11 DIAGNOSIS — M542 Cervicalgia: Secondary | ICD-10-CM | POA: Diagnosis not present

## 2016-09-11 DIAGNOSIS — R293 Abnormal posture: Secondary | ICD-10-CM

## 2016-09-11 DIAGNOSIS — R252 Cramp and spasm: Secondary | ICD-10-CM

## 2016-09-11 NOTE — Therapy (Signed)
Tamara Watkins, [place], [address] Phone: [phone]   Fax:  [phone]  Physical Therapy Treatment  Patient Details  Name: Tamara Watkins MRN: [id number] Date of Birth: 1966-08-10 Referring Provider: Shanon Ace MD  Encounter Date: 09/11/2016      PT End of Session - 09/11/16 1206    Visit Number 3   Number of Visits 12   Date for PT Re-Evaluation 10/12/16   PT Start Time 1101   PT Stop Time 1155   PT Time Calculation (min) 54 min   Activity Tolerance Patient tolerated treatment well   Behavior During Therapy [facility] for tasks assessed/performed      Past Medical History:  Diagnosis Date  . Agatston coronary artery calcium score less than 100 2013  . Anxiety   . Environmental allergies   . GERD (gastroesophageal reflux disease)    pt hew symptoms, difficulty swallowing, lump in throat  . Gynecomastia 03/01/2012  . Headache(784.0)   . History of syncope    with low bp orthostatic onset hs  . Hx of colonic polyps    5 year recall history of rectal bleeding  . Hx of skin cancer, basal cell   . Hyperlipidemia   . Internal hemorrhoids   . Migraine   . Urinary incontinence    related to childbirth urge and stress [name] helps    Past Surgical History:  Procedure Laterality Date  . CESAREAN SECTION    . DESTRUCTION OF LESION ANAL    . DILATION AND CURETTAGE OF UTERUS     for miscarrage  . REDUCTION MAMMAPLASTY  2013    There were no vitals filed for this visit.      Subjective Assessment - 09/11/16 1106    Subjective "not as tight, feeling alittle bruised and tender but improving"    Currently in Pain? Yes   Pain Score 3    Pain Location Neck   Pain Orientation Right;Left  L>R   Pain Descriptors / Indicators Tightness   Pain Onset More than a month ago   Aggravating Factors  reading in bed, sleeping on too many pillows, children hanging from neck, rotation L and R   Pain Relieving Factors  Medication, heat, massage                         [facility] Adult PT Treatment/Exercise - 09/11/16 0001      Neck Exercises: Supine   Neck Retraction 10 reps;5 secs  VC for form     Shoulder Exercises: Sidelying   Other Sidelying Exercises bookopening 1 x 10 bil     Moist Heat Therapy   Number Minutes Moist Heat 10 Minutes   Moist Heat Location Cervical     Manual Therapy   Manual Therapy Myofascial release;Taping   Joint Mobilization C-3 to C-6 lateral PA grade 3/4, suboccipital release   Soft tissue mobilization soft tissue for bil cervical paraspinals. Upper trap and levator bil   Myofascial Release stretching/ rolling over bil upper traps/ levator scapulae   McConnell inhibition taping on R shoulder   trial, pt reported decrased tightness and improved ROM     Neck Exercises: Stretches   Upper Trapezius Stretch 2 reps;30 seconds   Levator Stretch 2 reps;30 seconds          Trigger Point Dry Needling - 09/11/16 1109    Consent Given? Yes   Education Handout Provided Yes  PT Education - 09/11/16 1206    Education provided Yes   Education Details updated HEP for thoracic mobility and benefits of inhibition taping and length of duration to wear tape   Person(s) Educated Patient   Methods Explanation;Verbal cues;Handout   Comprehension Verbalized understanding;Verbal cues required             PT Long Term Goals - 09/07/16 1205      PT LONG TERM GOAL #1   Title "Demonstrate and verbalize techniques to reduce the risk of re-injury including: lifting, posture, body mechanics.    Time 6   Period Weeks   Status On-going     PT LONG TERM GOAL #2   Title "Pt will be independent with advanced HEP.    Time 6   Period Weeks   Status On-going     PT LONG TERM GOAL #3   Title "Pt will not wake due to pain  while turning in bed while sleeping at night   Time 6   Period Weeks   Status On-going     PT LONG TERM GOAL #4   Title  "FOTO will improve from  41% limitation  to  30% limitation   indicating improved functional mobility   Time 6   Period Weeks   Status On-going     PT LONG TERM GOAL #5   Title Pt will be able to drive and turn neck without exacerbating pain.   Time 6   Period Weeks   Status On-going               Plan - 09/11/16 1207    Clinical Impression Statement Mrs. Stodghill reports some improvement in neck mobility but continues to have pain at end range rotation to the L/R. continued DN of bil upper traps, followed with soft tissue work. performed trial of inhibition taping for R upper trap and perofrmed throacic mobility exercise.    PT Treatment/Interventions Cryotherapy;Electrical Stimulation;Iontophoresis 4mg /ml Dexamethasone;Moist Heat;Traction;Patient/family education;Therapeutic exercise;Manual techniques;Passive range of motion;Taping;Dry needling   PT Next Visit Plan assess response to DN/ inhibition taping,  thoracic mobility, review scapular stabilizers, add deep neck flexor exercises.    PT Home Exercise Plan neck stretches, and scapular stabilizers with red t band, book opening,    Consulted and Agree with Plan of Care Patient      Patient will benefit from skilled therapeutic intervention in order to improve the following deficits and impairments:  Postural dysfunction, Pain, Decreased range of motion, Increased muscle spasms, Improper body mechanics  Visit Diagnosis: Cervicalgia  Abnormal posture  Cramp and spasm     Problem List Patient Active Problem List   Diagnosis Date Noted  . Breakthrough bleeding mid cycle  01/27/2015  . Irregular menstrual cycle 06/08/2014  . Weight gain 06/08/2014  . Hoarseness 06/08/2014  . Sore throat 06/08/2014  . Sleep disturbance 06/08/2014  . Abnormal skin color 10/30/2012  . Agatston coronary artery calcium score less than 100   . Neck pain, chronic 03/01/2012  . Chronic upper back pain 03/01/2012  . Allergic rhinitis,  seasonal 03/01/2012  . Cervicogenic migraine 03/01/2012  . Encounter for preventive health examination 05/22/2011  . Goiter 04/28/2011  . POSTNASAL DRIP SYNDROME 08/05/2010  . CERVICAL LYMPHADENOPATHY, RIGHT 08/05/2010  . FATIGUE 04/09/2009  . Hyperlipidemia 03/09/2009  . PANIC DISORDER 03/09/2009  . URINARY INCONTINENCE 03/09/2009  . SKIN CANCER, HX OF 03/09/2009  . COLONIC POLYPS, HX OF 03/09/2009   Starr Lake PT, DPT, LAT, ATC  09/11/16  12:22 PM      [facility] [address] [place], [place], [address] Phone: [phone]   Fax:  [phone]  Name: Luanda Kirschenman MRN: [id number] Date of Birth: [birth year]

## 2016-09-18 ENCOUNTER — Ambulatory Visit: Payer: 59 | Admitting: Physical Therapy

## 2016-09-18 DIAGNOSIS — M542 Cervicalgia: Secondary | ICD-10-CM | POA: Diagnosis not present

## 2016-09-18 DIAGNOSIS — R293 Abnormal posture: Secondary | ICD-10-CM

## 2016-09-18 DIAGNOSIS — R252 Cramp and spasm: Secondary | ICD-10-CM

## 2016-09-18 NOTE — Therapy (Signed)
[place], [place], [address] Phone: [phone]   Fax:  [phone]  Physical Therapy Treatment  Patient Details  Name: Tamara Watkins MRN: [id number] Date of Birth: Sep 21, 1966 Referring Provider: Shanon Ace MD  Encounter Date: 09/18/2016      PT End of Session - 09/18/16 1006    Visit Number 4   Number of Visits 12   Date for PT Re-Evaluation 10/12/16   PT Start Time 0935   PT Stop Time 1018   PT Time Calculation (min) 43 min   Activity Tolerance Patient tolerated treatment well   Behavior During Therapy [facility] for tasks assessed/performed      Past Medical History:  Diagnosis Date  . Agatston coronary artery calcium score less than 100 2013  . Anxiety   . Environmental allergies   . GERD (gastroesophageal reflux disease)    pt hew symptoms, difficulty swallowing, lump in throat  . Gynecomastia 03/01/2012  . Headache(784.0)   . History of syncope    with low bp orthostatic onset hs  . Hx of colonic polyps    5 year recall history of rectal bleeding  . Hx of skin cancer, basal cell   . Hyperlipidemia   . Internal hemorrhoids   . Migraine   . Urinary incontinence    related to childbirth urge and stress Tamara Watkins helps    Past Surgical History:  Procedure Laterality Date  . CESAREAN SECTION    . DESTRUCTION OF LESION ANAL    . DILATION AND CURETTAGE OF UTERUS     for miscarrage  . REDUCTION MAMMAPLASTY  2013    There were no vitals filed for this visit.      Subjective Assessment - 09/18/16 0936    Subjective "did alot of flying around, feeling better today, but was really sore yesterday and worse with looking up and to the"   Currently in Pain? Yes   Pain Score 2   5/10 with looking up to the side.   Pain Location Neck   Pain Orientation Right;Left   Pain Descriptors / Indicators Tightness   Pain Type Chronic pain   Pain Onset More than a month ago   Pain Frequency Intermittent    Aggravating Factors  looking up and to the R/ L   Pain Relieving Factors heat, massage                         [facility] Adult PT Treatment/Exercise - 09/18/16 0001      Neck Exercises: Supine   Neck Retraction 10 reps;5 secs     Moist Heat Therapy   Number Minutes Moist Heat 10 Minutes   Moist Heat Location Cervical     Manual Therapy   Joint Mobilization C-3 to C-6 lateral PA grade 3/4, suboccipital release   Soft tissue mobilization soft tissue for bil cervical paraspinals and sub-occipitlas   Myofascial Release stretching/ rolling over cervical paraspinals     Neck Exercises: Stretches   Upper Trapezius Stretch 2 reps;30 seconds   Levator Stretch 2 reps;30 seconds          Trigger Point Dry Needling - 09/18/16 0939    Consent Given? Yes   Education Handout Provided Yes   Muscles Treated Upper Body Longissimus   SubOccipitals Response Twitch response elicited;Palpable increased muscle length   Longissimus Response Twitch response elicited;Palpable increased muscle length  cervical multifidus c5-C3 bil  PT Education - 09/18/16 1005    Education provided Yes   Education Details sub-occipital release using tennis  bals for headache.    Person(s) Educated Patient   Methods Explanation;Verbal cues   Comprehension Verbalized understanding;Verbal cues required             PT Long Term Goals - 09/07/16 1205      PT LONG TERM GOAL #1   Title "Demonstrate and verbalize techniques to reduce the risk of re-injury including: lifting, posture, body mechanics.    Time 6   Period Weeks   Status On-going     PT LONG TERM GOAL #2   Title "Pt will be independent with advanced HEP.    Time 6   Period Weeks   Status On-going     PT LONG TERM GOAL #3   Title "Pt will not wake due to pain  while turning in bed while sleeping at night   Time 6   Period Weeks   Status On-going     PT LONG TERM GOAL #4   Title "FOTO will improve from  41%  limitation  to  30% limitation   indicating improved functional mobility   Time 6   Period Weeks   Status On-going     PT LONG TERM GOAL #5   Title Pt will be able to drive and turn neck without exacerbating pain.   Time 6   Period Weeks   Status On-going               Plan - 09/18/16 1053    Clinical Impression Statement Tamara Watkins reports some relief of pain in the upper traps but conintued tightness in the sub-occipitals and cervical parapsinals, DN was performed on bil sub-occipitals and cervical paraspinals at C3-C5 bil. Following with IASTM and myofastial release techniques. she was able to perform streching and educated on sub-occiptal release techniques for pt to perform at home using tennis balls. she reported no headache post session and improved cervial mobility.    PT Treatment/Interventions Cryotherapy;Electrical Stimulation;Iontophoresis 4mg /ml Dexamethasone;Moist Heat;Traction;Patient/family education;Therapeutic exercise;Manual techniques;Passive range of motion;Taping;Dry needling   PT Next Visit Plan assess response to DN/ inhibition taping,  thoracic mobility, review scapular stab,    PT Home Exercise Plan neck stretches, and scapular stabilizers with red t band, book opening, sub-occipital release (with tennis balls)   Consulted and Agree with Plan of Care Patient      Patient will benefit from skilled therapeutic intervention in order to improve the following deficits and impairments:  Postural dysfunction, Pain, Decreased range of motion, Increased muscle spasms, Improper body mechanics  Visit Diagnosis: Abnormal posture  Cervicalgia  Cramp and spasm     Problem List Patient Active Problem List   Diagnosis Date Noted  . Breakthrough bleeding mid cycle  01/27/2015  . Irregular menstrual cycle 06/08/2014  . Weight gain 06/08/2014  . Hoarseness 06/08/2014  . Sore throat 06/08/2014  . Sleep disturbance 06/08/2014  . Abnormal skin color 10/30/2012   . Agatston coronary artery calcium score less than 100   . Neck pain, chronic 03/01/2012  . Chronic upper back pain 03/01/2012  . Allergic rhinitis, seasonal 03/01/2012  . Cervicogenic migraine 03/01/2012  . Encounter for preventive health examination 05/22/2011  . Goiter 04/28/2011  . POSTNASAL DRIP SYNDROME 08/05/2010  . CERVICAL LYMPHADENOPATHY, RIGHT 08/05/2010  . FATIGUE 04/09/2009  . Hyperlipidemia 03/09/2009  . PANIC DISORDER 03/09/2009  . URINARY INCONTINENCE 03/09/2009  . SKIN CANCER, HX OF 03/09/2009  .  COLONIC POLYPS, HX OF 03/09/2009   Starr Lake PT, DPT, LAT, ATC  09/18/16  10:59 AM      [facility] [address] [place], [place], [address] Phone: [phone]   Fax:  [phone]  Name: Tamara Watkins MRN: [id number] Date of Birth: August 18, 1966

## 2016-09-21 ENCOUNTER — Ambulatory Visit: Payer: 59 | Admitting: Physical Therapy

## 2016-09-21 DIAGNOSIS — M542 Cervicalgia: Secondary | ICD-10-CM | POA: Diagnosis not present

## 2016-09-21 DIAGNOSIS — R293 Abnormal posture: Secondary | ICD-10-CM

## 2016-09-21 DIAGNOSIS — R252 Cramp and spasm: Secondary | ICD-10-CM

## 2016-09-21 NOTE — Therapy (Signed)
[place], [place], [address] Phone: [phone]   Fax:  [phone]  Physical Therapy Treatment  Patient Details  Name: Tamara Watkins MRN: [id number] Date of Birth: 1966-10-24 Referring Provider: Shanon Ace MD  Encounter Date: 09/21/2016      PT End of Session - 09/21/16 1554    Visit Number 5   Number of Visits 12   Date for PT Re-Evaluation 10/12/16   PT Start Time 1503   PT Stop Time 1550   PT Time Calculation (min) 47 min   Activity Tolerance Patient tolerated treatment well   Behavior During Therapy [facility] for tasks assessed/performed      Past Medical History:  Diagnosis Date  . Agatston coronary artery calcium score less than 100 2013  . Anxiety   . Environmental allergies   . GERD (gastroesophageal reflux disease)    pt hew symptoms, difficulty swallowing, lump in throat  . Gynecomastia 03/01/2012  . Headache(784.0)   . History of syncope    with low bp orthostatic onset hs  . Hx of colonic polyps    5 year recall history of rectal bleeding  . Hx of skin cancer, basal cell   . Hyperlipidemia   . Internal hemorrhoids   . Migraine   . Urinary incontinence    related to childbirth urge and stress [name] helps    Past Surgical History:  Procedure Laterality Date  . CESAREAN SECTION    . DESTRUCTION OF LESION ANAL    . DILATION AND CURETTAGE OF UTERUS     for miscarrage  . REDUCTION MAMMAPLASTY  2013    There were no vitals filed for this visit.      Subjective Assessment - 09/21/16 1507    Subjective "i was feeling alittle better but then Looked to the L suddenly and felt sharp pain in the Left side of the neck and it has only gotten alittle better with massage and medication"    Currently in Pain? Yes   Pain Score 7    Pain Location Neck   Pain Orientation Left   Pain Descriptors / Indicators Aching;Sharp   Pain Type Acute pain   Pain Onset More than a month ago   Pain  Frequency Constant                         [facility] Adult PT Treatment/Exercise - 09/21/16 0001      Modalities   Modalities Traction     Traction   Type of Traction Cervical   Min (lbs) 6   Max (lbs) 12   Hold Time 60   Rest Time 30   Time 15     Manual Therapy   Manual Therapy Passive ROM   Joint Mobilization C-3 to C-6 lateral PA grade 3/4, suboccipital release   Soft tissue mobilization soft tissue for bil cervical paraspinals and sub-occipitlas   Passive ROM distraction with L cervical rotation     Neck Exercises: Stretches   Upper Trapezius Stretch 2 reps;30 seconds   Levator Stretch 2 reps;30 seconds                     PT Long Term Goals - 09/07/16 1205      PT LONG TERM GOAL #1   Title "Demonstrate and verbalize techniques to reduce the risk of re-injury including: lifting, posture, body mechanics.    Time 6   Period Weeks  Status On-going     PT LONG TERM GOAL #2   Title "Pt will be independent with advanced HEP.    Time 6   Period Weeks   Status On-going     PT LONG TERM GOAL #3   Title "Pt will not wake due to pain  while turning in bed while sleeping at night   Time 6   Period Weeks   Status On-going     PT LONG TERM GOAL #4   Title "FOTO will improve from  41% limitation  to  30% limitation   indicating improved functional mobility   Time 6   Period Weeks   Status On-going     PT LONG TERM GOAL #5   Title Pt will be able to drive and turn neck without exacerbating pain.   Time 6   Period Weeks   Status On-going               Plan - 09/21/16 1554    Clinical Impression Statement Mrs. Harriger states she has increased soreness today in the L neck/ upper trap, which appears to be facet irritation. Following PROM with distraction and gapping mobs she reported pain dropped to 2/10 with only tightness in the L upper trap. attempted trial of cervical traction which she reproted no pain post session.    PT Next  Visit Plan response to traction, thoracic mobility, review scapular stab, cervical mobs.    Consulted and Agree with Plan of Care Patient      Patient will benefit from skilled therapeutic intervention in order to improve the following deficits and impairments:     Visit Diagnosis: Abnormal posture  Cervicalgia  Cramp and spasm     Problem List Patient Active Problem List   Diagnosis Date Noted  . Breakthrough bleeding mid cycle  01/27/2015  . Irregular menstrual cycle 06/08/2014  . Weight gain 06/08/2014  . Hoarseness 06/08/2014  . Sore throat 06/08/2014  . Sleep disturbance 06/08/2014  . Abnormal skin color 10/30/2012  . Agatston coronary artery calcium score less than 100   . Neck pain, chronic 03/01/2012  . Chronic upper back pain 03/01/2012  . Allergic rhinitis, seasonal 03/01/2012  . Cervicogenic migraine 03/01/2012  . Encounter for preventive health examination 05/22/2011  . Goiter 04/28/2011  . POSTNASAL DRIP SYNDROME 08/05/2010  . CERVICAL LYMPHADENOPATHY, RIGHT 08/05/2010  . FATIGUE 04/09/2009  . Hyperlipidemia 03/09/2009  . PANIC DISORDER 03/09/2009  . URINARY INCONTINENCE 03/09/2009  . SKIN CANCER, HX OF 03/09/2009  . COLONIC POLYPS, HX OF 03/09/2009   Starr Lake PT, DPT, LAT, ATC  09/21/16  4:00 PM      [place] [address] [place], [place], [address] Phone: [phone]   Fax:  [phone]  Name: Alyssah Ware MRN: [id number] Date of Birth: 04-19-1966

## 2016-09-25 ENCOUNTER — Ambulatory Visit: Payer: 59 | Admitting: Physical Therapy

## 2016-09-25 DIAGNOSIS — M542 Cervicalgia: Secondary | ICD-10-CM | POA: Diagnosis not present

## 2016-09-25 DIAGNOSIS — R293 Abnormal posture: Secondary | ICD-10-CM

## 2016-09-25 DIAGNOSIS — R252 Cramp and spasm: Secondary | ICD-10-CM

## 2016-09-25 NOTE — Therapy (Signed)
[place] Tam[place], [place], [address] Phone: [phone]   Fax:  [phone]  Physical Therapy Treatment  Patient Details  Name: Tamara Watkins MRN: [id number] Date of Birth: 1966-06-26 Referring Provider: Shanon Ace MD  Encounter Date: 09/25/2016      PT End of Session - 09/25/16 1506    Visit Number 6   Number of Visits 12   Date for PT Re-Evaluation 10/12/16   PT Start Time [id number]   PT Stop Time 1506   PT Time Calculation (min) 48 min   Activity Tolerance Patient tolerated treatment well   Behavior During Therapy [facility] for tasks assessed/performed      Past Medical History:  Diagnosis Date  . Agatston coronary artery calcium score less than 100 2013  . Anxiety   . Environmental allergies   . GERD (gastroesophageal reflux disease)    pt hew symptoms, difficulty swallowing, lump in throat  . Gynecomastia 03/01/2012  . Headache(784.0)   . History of syncope    with low bp orthostatic onset hs  . Hx of colonic polyps    5 year recall history of rectal bleeding  . Hx of skin cancer, basal cell   . Hyperlipidemia   . Internal hemorrhoids   . Migraine   . Urinary incontinence    related to childbirth urge and stress Tamara Watkins helps    Past Surgical History:  Procedure Laterality Date  . CESAREAN SECTION    . DESTRUCTION OF LESION ANAL    . DILATION AND CURETTAGE OF UTERUS     for miscarrage  . REDUCTION MAMMAPLASTY  2013    There were no vitals filed for this visit.      Subjective Assessment - 09/25/16 1424    Subjective " I am feeling much today since the last session, I think last session really helped"   Currently in Pain? Yes   Pain Score 3   to the R an dup   Pain Orientation Left   Pain Descriptors / Indicators Aching;Sore   Pain Type Chronic pain   Pain Onset More than a month ago   Pain Frequency Intermittent   Aggravating Factors  looking up and to the R   Pain Relieving Factors  heat, massage                         [facility] Adult PT Treatment/Exercise - 09/25/16 0001      Neck Exercises: Seated   Other Seated Exercise upper cervical rotation x 10 starting with 4 fingers against chest, progressing each set to no fingers   no pain or tightness following exercise.      Traction   Type of Traction Cervical   Min (lbs) 7   Max (lbs) 14   Hold Time 60   Rest Time 30   Time 15     Manual Therapy   Joint Mobilization C-3 to C-6 lateral gapping to the R,  first rib grade 3 mobs with pt breathing in/out,    Soft tissue mobilization soft tissue for bil cervical paraspinals R scalenes   Passive ROM distraction with R cervical rotation                PT Education - 09/25/16 1504    Education provided Yes   Education Details updated HEP for upper cervical mobility with form and rationale.    Person(s) Educated Patient   Methods Explanation;Verbal cues;Handout   Comprehension  Verbalized understanding;Verbal cues required             PT Long Term Goals - 09/07/16 1205      PT LONG TERM GOAL #1   Title "Demonstrate and verbalize techniques to reduce the risk of re-injury including: lifting, posture, body mechanics.    Time 6   Period Weeks   Status On-going     PT LONG TERM GOAL #2   Title "Pt will be independent with advanced HEP.    Time 6   Period Weeks   Status On-going     PT LONG TERM GOAL #3   Title "Pt will not wake due to pain  while turning in bed while sleeping at night   Time 6   Period Weeks   Status On-going     PT LONG TERM GOAL #4   Title "FOTO will improve from  41% limitation  to  30% limitation   indicating improved functional mobility   Time 6   Period Weeks   Status On-going     PT LONG TERM GOAL #5   Title Pt will be able to drive and turn neck without exacerbating pain.   Time 6   Period Weeks   Status On-going               Plan - 09/25/16 1506    Clinical Impression Statement [name] reports relief of pain looking to the L following last session. Continued PROM with cervical mobility, and gapping mobs/ 1st rib mobs. manual trigger point release techniques over Scalenes and continued cervical traction. post session pt reported she was able to look to the L/R with no pain or tightness.    PT Next Visit Plan response to traction, thoracic mobility, review scapular stab, cervical mobs. ROM, goals.    PT Home Exercise Plan neck stretches, and scapular stabilizers with red t band, book opening, sub-occipital release (with tennis balls), upper cervical rotation.   Consulted and Agree with Plan of Care Patient      Patient will benefit from skilled therapeutic intervention in order to improve the following deficits and impairments:  Postural dysfunction, Pain, Decreased range of motion, Increased muscle spasms, Improper body mechanics  Visit Diagnosis: Abnormal posture  Cervicalgia  Cramp and spasm     Problem List Patient Active Problem List   Diagnosis Date Noted  . Breakthrough bleeding mid cycle  01/27/2015  . Irregular menstrual cycle 06/08/2014  . Weight gain 06/08/2014  . Hoarseness 06/08/2014  . Sore throat 06/08/2014  . Sleep disturbance 06/08/2014  . Abnormal skin color 10/30/2012  . Agatston coronary artery calcium score less than 100   . Neck pain, chronic 03/01/2012  . Chronic upper back pain 03/01/2012  . Allergic rhinitis, seasonal 03/01/2012  . Cervicogenic migraine 03/01/2012  . Encounter for preventive health examination 05/22/2011  . Goiter 04/28/2011  . POSTNASAL DRIP SYNDROME 08/05/2010  . CERVICAL LYMPHADENOPATHY, RIGHT 08/05/2010  . FATIGUE 04/09/2009  . Hyperlipidemia 03/09/2009  . PANIC DISORDER 03/09/2009  . URINARY INCONTINENCE 03/09/2009  . SKIN CANCER, HX OF 03/09/2009  . COLONIC POLYPS, HX OF 03/09/2009   Tamara Watkins PT, DPT, LAT, ATC  09/25/16  3:13 PM      [place] [facility] [address] [place], [place], [address] Phone: [phone]   Fax:  [phone]  Name: Tamara Watkins MRN: [id number] Date of Birth: June 27, 1966

## 2016-10-06 ENCOUNTER — Ambulatory Visit: Payer: 59 | Attending: Internal Medicine | Admitting: Physical Therapy

## 2016-10-06 DIAGNOSIS — R252 Cramp and spasm: Secondary | ICD-10-CM | POA: Diagnosis present

## 2016-10-06 DIAGNOSIS — R293 Abnormal posture: Secondary | ICD-10-CM | POA: Insufficient documentation

## 2016-10-06 DIAGNOSIS — M542 Cervicalgia: Secondary | ICD-10-CM | POA: Diagnosis present

## 2016-10-06 NOTE — Therapy (Signed)
[place], [place], [address] Phone: [phone]   Fax:  [phone]  Physical Therapy Treatment / Discharged Note  Patient Details  Name: Tamara Watkins MRN: [id number] Date of Birth: [birth year] Referring Provider: Shanon Ace MD  Encounter Date: 10/06/2016      PT End of Session - 10/06/16 1049    Visit Number 7   Number of Visits 12   Date for PT Re-Evaluation 10/12/16   PT Start Time 1029  pt arrived 14 minutes late   PT Stop Time 1100   PT Time Calculation (min) 31 min   Activity Tolerance Patient tolerated treatment well   Behavior During Therapy [facility] for tasks assessed/performed      Past Medical History:  Diagnosis Date  . Agatston coronary artery calcium score less than 100 2013  . Anxiety   . Environmental allergies   . GERD (gastroesophageal reflux disease)    pt hew symptoms, difficulty swallowing, lump in throat  . Gynecomastia 03/01/2012  . Headache(784.0)   . History of syncope    with low bp orthostatic onset hs  . Hx of colonic polyps    5 year recall history of rectal bleeding  . Hx of skin cancer, basal cell   . Hyperlipidemia   . Internal hemorrhoids   . Migraine   . Urinary incontinence    related to childbirth urge and stress [name] helps    Past Surgical History:  Procedure Laterality Date  . CESAREAN SECTION    . DESTRUCTION OF LESION ANAL    . DILATION AND CURETTAGE OF UTERUS     for miscarrage  . REDUCTION MAMMAPLASTY  2013    There were no vitals filed for this visit.      Subjective Assessment - 10/06/16 1030    Subjective "I have been feeling pretty stiff, but it could be due to flying around"    Currently in Pain? Yes   Pain Score 0-No pain  only 3/10 when going into the extreme ranges   Pain Orientation Left   Pain Type Chronic pain   Pain Onset More than a month ago   Pain Frequency Occasional   Aggravating Factors  extreme ranges up and to L/R    Pain Relieving Factors heat/ massage            [facility] PT Assessment - 10/06/16 1040      Observation/Other Assessments   Focus on Therapeutic Outcomes (FOTO)  27% limited     AROM   Cervical Flexion 74   Cervical Extension 64   Cervical - Right Side Bend 40   Cervical - Left Side Bend 42   Cervical - Right Rotation 76   Cervical - Left Rotation 75                     [facility] Adult PT Treatment/Exercise - 10/06/16 0001      Manual Therapy   Manual therapy comments sub-occipital release     Neck Exercises: Stretches   Upper Trapezius Stretch 2 reps;30 seconds                PT Education - 10/06/16 1112    Education provided Yes   Education Details manual trigger point release techniques using theracane and where she can purchase one or simliar tools. Reviewed previously given HEP, how to perform sub-occipital release with tennis balls, progressing exercises to maintain and promote endurance with prolonged activities.  Person(s) Educated Patient   Methods Explanation;Verbal cues   Comprehension Verbalized understanding;Verbal cues required             PT Long Term Goals - 10/06/16 1113      PT LONG TERM GOAL #1   Title "Demonstrate and verbalize techniques to reduce the risk of re-injury including: lifting, posture, body mechanics.    Time 6   Period Weeks   Status Achieved     PT LONG TERM GOAL #2   Title "Pt will be independent with advanced HEP.    Time 6   Period Weeks   Status Achieved     PT LONG TERM GOAL #3   Title "Pt will not wake due to pain  while turning in bed while sleeping at night   Time 6   Period Weeks   Status Achieved     PT LONG TERM GOAL #5   Title Pt will be able to drive and turn neck without exacerbating pain.   Time 6   Period Weeks   Status Achieved               Plan - 10/06/16 1109    Clinical Impression Statement Mrs. Hayward arrived 14 minutes late today. She has improved cervical  mobility in all planes with only soreness noted at end range looking up and to the R/L. following manual trigger point release she reported decreased tightness with looking up and to the R/L. She met all goals this visit and is able to maintain and progress her current level to funciton independently and will be discharged from PT today.    PT Next Visit Plan discharged from PT   PT Home Exercise Plan neck stretches, and scapular stabilizers with red t band, book opening, sub-occipital release (with tennis balls), upper cervical rotation. manual trigger point release   Consulted and Agree with Plan of Care Patient      Patient will benefit from skilled therapeutic intervention in order to improve the following deficits and impairments:  Postural dysfunction, Pain, Decreased range of motion, Increased muscle spasms, Improper body mechanics  Visit Diagnosis: Abnormal posture  Cervicalgia  Cramp and spasm     Problem List Patient Active Problem List   Diagnosis Date Noted  . Breakthrough bleeding mid cycle  01/27/2015  . Irregular menstrual cycle 06/08/2014  . Weight gain 06/08/2014  . Hoarseness 06/08/2014  . Sore throat 06/08/2014  . Sleep disturbance 06/08/2014  . Abnormal skin color 10/30/2012  . Agatston coronary artery calcium score less than 100   . Neck pain, chronic 03/01/2012  . Chronic upper back pain 03/01/2012  . Allergic rhinitis, seasonal 03/01/2012  . Cervicogenic migraine 03/01/2012  . Encounter for preventive health examination 05/22/2011  . Goiter 04/28/2011  . POSTNASAL DRIP SYNDROME 08/05/2010  . CERVICAL LYMPHADENOPATHY, RIGHT 08/05/2010  . FATIGUE 04/09/2009  . Hyperlipidemia 03/09/2009  . PANIC DISORDER 03/09/2009  . URINARY INCONTINENCE 03/09/2009  . SKIN CANCER, HX OF 03/09/2009  . COLONIC POLYPS, HX OF 03/09/2009    Starr Lake PT, DPT, LAT, ATC  10/06/16  11:16 AM      [facility] [address] [place], [place], [address] Phone: [phone]   Fax:  [phone]  Name: Leily Capek MRN: [id number] Date of Birth: June 04, 1966  PHYSICAL THERAPY DISCHARGE SUMMARY  Visits from Start of Care: 7  Current functional level related to goals / functional outcomes: FOTO 27% limited   Remaining deficits: Tightness noted at  extreme end range rotation with extension to the L and R.    Education / Equipment: HEP, posture, theraband for strengthening, manual trigger point release techniques  Plan: Patient agrees to discharge.  Patient goals were met. Patient is being discharged due to meeting the stated rehab goals.  ?????

## 2016-10-10 ENCOUNTER — Ambulatory Visit: Payer: 59 | Admitting: Physical Therapy

## 2016-12-29 ENCOUNTER — Telehealth: Payer: Self-pay | Admitting: Internal Medicine

## 2016-12-29 NOTE — Telephone Encounter (Signed)
° ° °  Pt call to say she is still having neck pain and has had physical therapy. She said she thinks she need to have a MRI   Would like a call back

## 2016-12-29 NOTE — Telephone Encounter (Signed)
If pt is wishing to get MRI than she will need to come in and speak to Dr. Regis Bill. Please help her to get on the schedule.

## 2017-01-01 NOTE — Progress Notes (Signed)
Pre visit review using our clinic review tool, if applicable. No additional management support is needed unless otherwise documented below in the visit note.  Chief Complaint  Patient presents with  . Sinus Pressure/Pain    On and off since Dec.  . Post Nasal Drip  . Cough  . Headache  . Nasal Congestion    HPI: Tamara Watkins 51 y.o.  sda    Onset like a cold and never better  Now  3 days of ha and face pain   Upper teeth  Cough  .     Feels move araoun d. In face   Onset .  resp after x mas but never totally better  advil cold and sinus . Not helping now.    May be left side. More discomfort thatn right   Also PT help pain in neck but comes right back after that  And still interfereing pain  ? More evaluation? No numbness or weakness . Traveling this week air plain NO etc .  ROS: See pertinent positives and negatives per HPI. No cp sob hemoptysis fever chills   Past Medical History:  Diagnosis Date  . Agatston coronary artery calcium score less than 100 2013  . Anxiety   . Environmental allergies   . GERD (gastroesophageal reflux disease)    pt hew symptoms, difficulty swallowing, lump in throat  . Gynecomastia 03/01/2012  . Headache(784.0)   . History of syncope    with low bp orthostatic onset hs  . Hx of colonic polyps    5 year recall history of rectal bleeding  . Hx of skin cancer, basal cell   . Hyperlipidemia   . Internal hemorrhoids   . Migraine   . Urinary incontinence    related to childbirth urge and stress [name] helps    Family History  Problem Relation Age of Onset  . Hyperlipidemia Mother   . Depression Father   . Hypertension Father   . Other Father     tranverse mylitis  . Colon polyps Father   . Depression Brother   . Hypertension Brother   . Depression Daughter   . Anxiety disorder Daughter   . Allergies      Children  . Colon polyps Paternal Grandmother   . Colon cancer Neg Hx     Social History   Social History  . Marital status:  Married    Spouse name: N/A  . Number of children: N/A  . Years of education: N/A   Social History Main Topics  . Smoking status: Never Smoker  . Smokeless tobacco: Never Used  . Alcohol use 4.2 oz/week    7 Glasses of wine per week  . Drug use: No  . Sexual activity: Not Asked   Other Topics Concern  . None   Social History Narrative   Occupation: Programmer, applications, Masters level education, Travels 4 days per week   Married   Regular exercise- yes   HH of 4   No pets   Recently moved from [place]   1 glass wine per night    Outpatient Medications Prior to Visit  Medication Sig Dispense Refill  . ALPRAZolam (XANAX) 0.25 MG tablet Take 1 tablet (0.25 mg total) by mouth 3 (three) times daily as needed for sleep. For panic attack 30 tablet 0  . ibuprofen (ADVIL,MOTRIN) 200 MG tablet Take 600 mg by mouth every 6 (six) hours as needed. Neck Pain    . LEXAPRO 10  MG tablet Daily. Takes 1/2 to a whole tablet daily    . Multiple Vitamin (MULTI-VITAMIN PO) Take by mouth.    . ranitidine (ZANTAC) 150 MG tablet Take 150 mg by mouth 2 (two) times daily.    . cyclobenzaprine (FLEXERIL) 5 MG tablet Take 1 tablet (5 mg total) by mouth at bedtime as needed and may repeat dose one time if needed for muscle spasms. (Patient not taking: Reported on 08/31/2016) 20 tablet 0   No facility-administered medications prior to visit.      EXAM:  BP 122/64 (BP Location: Right Arm, Patient Position: Sitting, Cuff Size: Normal)   Temp 98.2 F (36.8 C) (Oral)   Wt 155 lb (70.3 kg)   BMI 25.40 kg/m   Body mass index is 25.4 kg/m. WDWN in NAD  quiet respirations; mildly congested  somewhat hoarse. Non toxic . HEENT: Normocephalic ;atraumatic , Eyes;  PERRL, EOMs  Full, lids and conjunctiva clear,,Ears: no deformities, canals nl, TM landmarks normal, Nose: no deformity or discharge but congested;face  tender  And left para orbital  Area left Mouth : OP clear without lesion or edema . Reed  No  lesions  Neck: without adenopathy or masses or bruits  Mild stiffness r   Chest:  Clear to A without wheezes rales or rhonchi CV:  S1-S2 no gallops or murmurs peripheral perfusion is normal Skin :nl perfusion and no acute rashes    ASSESSMENT AND PLAN:  Discussed the following assessment and plan:  Acute maxillary sinusitis, recurrence not specified  Neck pain - Plan: Ambulatory referral to Orthopedic Surgery Prolonged respiratory symptoms with recent relapsing and face pain will probably benefit from antibiotic nasal steroids etc. Expectant management. She still been battling neck pain Gets minor root relief, she is in physical therapy massage. We will refer for further opinion treatment. -Patient advised to return or notify health care team  if symptoms worsen ,persist or new concerns arise.  Patient Instructions  Continue decongestants going on the plain warm compresses antibiotics can add Flonase for 1-2 weeks to see if it decreases inflammation in the sinuses. We'll arrange a referral about your neck pain. Someone will contact you about an appointment in next couple weeks if you haven't heard from anyone contact us.   Sinusitis, Adult Sinusitis is soreness and inflammation of your sinuses. Sinuses are hollow spaces in the bones around your face. Your sinuses are located:  Around your eyes.  In the middle of your forehead.  Behind your nose.  In your cheekbones. Your sinuses and nasal passages are lined with a stringy fluid (mucus). Mucus normally drains out of your sinuses. When your nasal tissues become inflamed or swollen, the mucus can become trapped or blocked so air cannot flow through your sinuses. This allows bacteria, viruses, and funguses to grow, which leads to infection. Sinusitis can develop quickly and last for 7?10 days (acute) or for more than 12 weeks (chronic). Sinusitis often develops after a cold. What are the causes? This condition is caused by anything  that creates swelling in the sinuses or stops mucus from draining, including:  Allergies.  Asthma.  Bacterial or viral infection.  Abnormally shaped bones between the nasal passages.  Nasal growths that contain mucus (nasal polyps).  Narrow sinus openings.  Pollutants, such as chemicals or irritants in the air.  A foreign object stuck in the nose.  A fungal infection. This is rare. What increases the risk? The following factors may make you more likely to develop  this condition:  Having allergies or asthma.  Having had a recent cold or respiratory tract infection.  Having structural deformities or blockages in your nose or sinuses.  Having a weak immune system.  Doing a lot of swimming or diving.  Overusing nasal sprays.  Smoking. What are the signs or symptoms? The main symptoms of this condition are pain and a feeling of pressure around the affected sinuses. Other symptoms include:  Upper toothache.  Earache.  Headache.  Bad breath.  Decreased sense of smell and taste.  A cough that may get worse at night.  Fatigue.  Fever.  Thick drainage from your nose. The drainage is often green and it may contain pus (purulent).  Stuffy nose or congestion.  Postnasal drip. This is when extra mucus collects in the throat or back of the nose.  Swelling and warmth over the affected sinuses.  Sore throat.  Sensitivity to light. How is this diagnosed? This condition is diagnosed based on symptoms, a medical history, and a physical exam. To find out if your condition is acute or chronic, your health care provider may:  Look in your nose for signs of nasal polyps.  Tap over the affected sinus to check for signs of infection.  View the inside of your sinuses using an imaging device that has a light attached (endoscope). If your health care provider suspects that you have chronic sinusitis, you may also:  Be tested for allergies.  Have a sample of mucus taken  from your nose (nasal culture) and checked for bacteria.  Have a mucus sample examined to see if your sinusitis is related to an allergy. If your sinusitis does not respond to treatment and it lasts longer than 8 weeks, you may have an MRI or CT scan to check your sinuses. These scans also help to determine how severe your infection is. In rare cases, a bone biopsy may be done to rule out more serious types of fungal sinus disease. How is this treated? Treatment for sinusitis depends on the cause and whether your condition is chronic or acute. If a virus is causing your sinusitis, your symptoms will go away on their own within 10 days. You may be given medicines to relieve your symptoms, including:  Topical nasal decongestants. They shrink swollen nasal passages and let mucus drain from your sinuses.  Antihistamines. These drugs block inflammation that is triggered by allergies. This can help to ease swelling in your nose and sinuses.  Topical nasal corticosteroids. These are nasal sprays that ease inflammation and swelling in your nose and sinuses.  Nasal saline washes. These rinses can help to get rid of thick mucus in your nose. If your condition is caused by bacteria, you will be given an antibiotic medicine. If your condition is caused by a fungus, you will be given an antifungal medicine. Surgery may be needed to correct underlying conditions, such as narrow nasal passages. Surgery may also be needed to remove polyps. Follow these instructions at home: Medicines  Take, use, or apply over-the-counter and prescription medicines only as told by your health care provider. These may include nasal sprays.  If you were prescribed an antibiotic medicine, take it as told by your health care provider. Do not stop taking the antibiotic even if you start to feel better. Hydrate and Humidify  Drink enough water to keep your urine clear or pale yellow. Staying hydrated will help to thin your  mucus.  Use a cool mist humidifier to keep the  humidity level in your home above 50%.  Inhale steam for 10-15 minutes, 3-4 times a day or as told by your health care provider. You can do this in the bathroom while a hot shower is running.  Limit your exposure to cool or dry air. Rest  Rest as much as possible.  Sleep with your head raised (elevated).  Make sure to get enough sleep each night. General instructions  Apply a warm, moist washcloth to your face 3-4 times a day or as told by your health care provider. This will help with discomfort.  Wash your hands often with soap and water to reduce your exposure to viruses and other germs. If soap and water are not available, use hand sanitizer.  Do not smoke. Avoid being around people who are smoking (secondhand smoke).  Keep all follow-up visits as told by your health care provider. This is important. Contact a health care provider if:  You have a fever.  Your symptoms get worse.  Your symptoms do not improve within 10 days. Get help right away if:  You have a severe headache.  You have persistent vomiting.  You have pain or swelling around your face or eyes.  You have vision problems.  You develop confusion.  Your neck is stiff.  You have trouble breathing. This information is not intended to replace advice given to you by your health care provider. Make sure you discuss any questions you have with your health care provider. Document Released: 11/13/2005 Document Revised: 07/09/2016 Document Reviewed: 09/08/2015 Elsevier Interactive Patient Education  2017 [place] K. Yanis Juma M.D.

## 2017-01-02 ENCOUNTER — Encounter: Payer: Self-pay | Admitting: Internal Medicine

## 2017-01-02 ENCOUNTER — Ambulatory Visit (INDEPENDENT_AMBULATORY_CARE_PROVIDER_SITE_OTHER): Payer: 59 | Admitting: Internal Medicine

## 2017-01-02 VITALS — BP 122/64 | Temp 98.2°F | Wt 155.0 lb

## 2017-01-02 DIAGNOSIS — J0100 Acute maxillary sinusitis, unspecified: Secondary | ICD-10-CM

## 2017-01-02 DIAGNOSIS — M542 Cervicalgia: Secondary | ICD-10-CM

## 2017-01-02 MED ORDER — AMOXICILLIN-POT CLAVULANATE 875-125 MG PO TABS: 1.0000 | ORAL_TABLET | Freq: Two times a day (BID) | ORAL | 0 refills | Status: DC

## 2017-01-02 NOTE — Patient Instructions (Addendum)
Continue decongestants going on the plain warm compresses antibiotics can add Flonase for 1-2 weeks to see if it decreases inflammation in the sinuses. We'll arrange a referral about your neck pain. Someone will contact you about an appointment in next couple weeks if you haven't heard from anyone contact us.   Sinusitis, Adult Sinusitis is soreness and inflammation of your sinuses. Sinuses are hollow spaces in the bones around your face. Your sinuses are located:  Around your eyes.  In the middle of your forehead.  Behind your nose.  In your cheekbones. Your sinuses and nasal passages are lined with a stringy fluid (mucus). Mucus normally drains out of your sinuses. When your nasal tissues become inflamed or swollen, the mucus can become trapped or blocked so air cannot flow through your sinuses. This allows bacteria, viruses, and funguses to grow, which leads to infection. Sinusitis can develop quickly and last for 7?10 days (acute) or for more than 12 weeks (chronic). Sinusitis often develops after a cold. What are the causes? This condition is caused by anything that creates swelling in the sinuses or stops mucus from draining, including:  Allergies.  Asthma.  Bacterial or viral infection.  Abnormally shaped bones between the nasal passages.  Nasal growths that contain mucus (nasal polyps).  Narrow sinus openings.  Pollutants, such as chemicals or irritants in the air.  A foreign object stuck in the nose.  A fungal infection. This is rare. What increases the risk? The following factors may make you more likely to develop this condition:  Having allergies or asthma.  Having had a recent cold or respiratory tract infection.  Having structural deformities or blockages in your nose or sinuses.  Having a weak immune system.  Doing a lot of swimming or diving.  Overusing nasal sprays.  Smoking. What are the signs or symptoms? The main symptoms of this condition are  pain and a feeling of pressure around the affected sinuses. Other symptoms include:  Upper toothache.  Earache.  Headache.  Bad breath.  Decreased sense of smell and taste.  A cough that may get worse at night.  Fatigue.  Fever.  Thick drainage from your nose. The drainage is often green and it may contain pus (purulent).  Stuffy nose or congestion.  Postnasal drip. This is when extra mucus collects in the throat or back of the nose.  Swelling and warmth over the affected sinuses.  Sore throat.  Sensitivity to light. How is this diagnosed? This condition is diagnosed based on symptoms, a medical history, and a physical exam. To find out if your condition is acute or chronic, your health care provider may:  Look in your nose for signs of nasal polyps.  Tap over the affected sinus to check for signs of infection.  View the inside of your sinuses using an imaging device that has a light attached (endoscope). If your health care provider suspects that you have chronic sinusitis, you may also:  Be tested for allergies.  Have a sample of mucus taken from your nose (nasal culture) and checked for bacteria.  Have a mucus sample examined to see if your sinusitis is related to an allergy. If your sinusitis does not respond to treatment and it lasts longer than 8 weeks, you may have an MRI or CT scan to check your sinuses. These scans also help to determine how severe your infection is. In rare cases, a bone biopsy may be done to rule out more serious types of fungal sinus  disease. How is this treated? Treatment for sinusitis depends on the cause and whether your condition is chronic or acute. If a virus is causing your sinusitis, your symptoms will go away on their own within 10 days. You may be given medicines to relieve your symptoms, including:  Topical nasal decongestants. They shrink swollen nasal passages and let mucus drain from your sinuses.  Antihistamines. These  drugs block inflammation that is triggered by allergies. This can help to ease swelling in your nose and sinuses.  Topical nasal corticosteroids. These are nasal sprays that ease inflammation and swelling in your nose and sinuses.  Nasal saline washes. These rinses can help to get rid of thick mucus in your nose. If your condition is caused by bacteria, you will be given an antibiotic medicine. If your condition is caused by a fungus, you will be given an antifungal medicine. Surgery may be needed to correct underlying conditions, such as narrow nasal passages. Surgery may also be needed to remove polyps. Follow these instructions at home: Medicines  Take, use, or apply over-the-counter and prescription medicines only as told by your health care provider. These may include nasal sprays.  If you were prescribed an antibiotic medicine, take it as told by your health care provider. Do not stop taking the antibiotic even if you start to feel better. Hydrate and Humidify  Drink enough water to keep your urine clear or pale yellow. Staying hydrated will help to thin your mucus.  Use a cool mist humidifier to keep the humidity level in your home above 50%.  Inhale steam for 10-15 minutes, 3-4 times a day or as told by your health care provider. You can do this in the bathroom while a hot shower is running.  Limit your exposure to cool or dry air. Rest  Rest as much as possible.  Sleep with your head raised (elevated).  Make sure to get enough sleep each night. General instructions  Apply a warm, moist washcloth to your face 3-4 times a day or as told by your health care provider. This will help with discomfort.  Wash your hands often with soap and water to reduce your exposure to viruses and other germs. If soap and water are not available, use hand sanitizer.  Do not smoke. Avoid being around people who are smoking (secondhand smoke).  Keep all follow-up visits as told by your health  care provider. This is important. Contact a health care provider if:  You have a fever.  Your symptoms get worse.  Your symptoms do not improve within 10 days. Get help right away if:  You have a severe headache.  You have persistent vomiting.  You have pain or swelling around your face or eyes.  You have vision problems.  You develop confusion.  Your neck is stiff.  You have trouble breathing. This information is not intended to replace advice given to you by your health care provider. Make sure you discuss any questions you have with your health care provider. Document Released: 11/13/2005 Document Revised: 07/09/2016 Document Reviewed: 09/08/2015 Elsevier Interactive Patient Education  2017 [employer].

## 2017-01-16 DIAGNOSIS — M542 Cervicalgia: Secondary | ICD-10-CM | POA: Diagnosis not present

## 2017-03-28 ENCOUNTER — Encounter: Payer: Self-pay | Admitting: Family Medicine

## 2017-03-28 DIAGNOSIS — Z6824 Body mass index (BMI) 24.0-24.9, adult: Secondary | ICD-10-CM | POA: Diagnosis not present

## 2017-03-28 DIAGNOSIS — Z01419 Encounter for gynecological examination (general) (routine) without abnormal findings: Secondary | ICD-10-CM | POA: Diagnosis not present

## 2017-03-28 DIAGNOSIS — Z124 Encounter for screening for malignant neoplasm of cervix: Secondary | ICD-10-CM | POA: Diagnosis not present

## 2017-04-02 DIAGNOSIS — N920 Excessive and frequent menstruation with regular cycle: Secondary | ICD-10-CM | POA: Diagnosis not present

## 2017-04-19 ENCOUNTER — Encounter: Payer: Self-pay | Admitting: Family Medicine

## 2017-04-25 DIAGNOSIS — L509 Urticaria, unspecified: Secondary | ICD-10-CM | POA: Diagnosis not present

## 2017-04-25 DIAGNOSIS — Z85828 Personal history of other malignant neoplasm of skin: Secondary | ICD-10-CM | POA: Diagnosis not present

## 2017-04-25 DIAGNOSIS — L570 Actinic keratosis: Secondary | ICD-10-CM | POA: Diagnosis not present

## 2017-04-25 DIAGNOSIS — D2271 Melanocytic nevi of right lower limb, including hip: Secondary | ICD-10-CM | POA: Diagnosis not present

## 2017-04-25 DIAGNOSIS — D485 Neoplasm of uncertain behavior of skin: Secondary | ICD-10-CM | POA: Diagnosis not present

## 2017-04-25 DIAGNOSIS — L814 Other melanin hyperpigmentation: Secondary | ICD-10-CM | POA: Diagnosis not present

## 2017-08-27 ENCOUNTER — Encounter: Payer: Self-pay | Admitting: Gastroenterology

## 2017-08-27 ENCOUNTER — Ambulatory Visit (INDEPENDENT_AMBULATORY_CARE_PROVIDER_SITE_OTHER): Payer: 59 | Admitting: Gastroenterology

## 2017-08-27 VITALS — BP 110/82 | HR 80 | Ht 65.0 in | Wt 163.4 lb

## 2017-08-27 DIAGNOSIS — K219 Gastro-esophageal reflux disease without esophagitis: Secondary | ICD-10-CM | POA: Diagnosis not present

## 2017-08-27 DIAGNOSIS — R159 Full incontinence of feces: Secondary | ICD-10-CM | POA: Diagnosis not present

## 2017-08-27 DIAGNOSIS — R197 Diarrhea, unspecified: Secondary | ICD-10-CM | POA: Diagnosis not present

## 2017-08-27 DIAGNOSIS — R152 Fecal urgency: Secondary | ICD-10-CM | POA: Diagnosis not present

## 2017-08-27 DIAGNOSIS — R1013 Epigastric pain: Secondary | ICD-10-CM

## 2017-08-27 DIAGNOSIS — K625 Hemorrhage of anus and rectum: Secondary | ICD-10-CM | POA: Diagnosis not present

## 2017-08-27 MED ORDER — OMEPRAZOLE 40 MG PO CPDR: 40.0000 mg | DELAYED_RELEASE_CAPSULE | Freq: Every day | ORAL | 2 refills | Status: DC

## 2017-08-27 MED ORDER — CHOLESTYRAMINE LIGHT 4 GM/DOSE PO POWD: 4.0000 g | Freq: Every day | ORAL | 1 refills | Status: DC

## 2017-08-27 NOTE — Patient Instructions (Signed)
You have been scheduled for an endoscopy. Please follow written instructions given to you at your visit today. If you use inhalers (even only as needed), please bring them with you on the day of your procedure. Your physician has requested that you go to www.startemmi.com and enter the access code given to you at your visit today. This web site gives a general overview about your procedure. However, you should still follow specific instructions given to you by our office regarding your preparation for the procedure.  We will refer you to see Earlie Counts and they will contact you with that appointment   We will send in Cholestyramine and omeprazole to your pharmacy

## 2017-08-27 NOTE — Progress Notes (Signed)
Tamara Watkins    [id number]    06-02-1966  Primary Care Physician:Panosh, Standley Brooking, MD  Referring Physician: Burnis Medin, MD [place], [place] [address]  Chief complaint:  Epigastric pain, blood per rectum  HPI: 51 year old female here with complaints of intermittent severe epigastric abdominal pain for past 3-4 weeks . Denies any change in diet or lifting heavy weights. She takes NSAID's (Advil) occasionally about once or twice a week.  Heartburn is intermittent and sometimes associated with epigastric pain, improves with Zantac or Tums. Patient reports some improvement of epigastric pain after she eats a meal.  She has been having intermittent blood per rectum with mucus for past 1 year. She had fourth degree tear during childbirth and has fecal smearing and incontinence of mucus and gas. Patient reports intermittent episodes of diarrhea. Worse after she eats meals with greasy food or heavy cream. Denies having any issue with dairy products. Colonoscopy January 2015 was normal.   Outpatient Encounter Prescriptions as of 08/27/2017  Medication Sig  . ALPRAZolam (XANAX) 0.25 MG tablet Take 1 tablet (0.25 mg total) by mouth 3 (three) times daily as needed for sleep. For panic attack  . LEXAPRO 10 MG tablet Take 10 mg by mouth daily.   . Multiple Vitamin (MULTI-VITAMIN PO) Take by mouth.  . oxybutynin (DITROPAN) 5 MG tablet Take 5 mg by mouth 2 (two) times daily.  . ranitidine (ZANTAC) 150 MG tablet Take 150 mg by mouth as needed.   . [DISCONTINUED] amoxicillin-clavulanate (AUGMENTIN) 875-125 MG tablet Take 1 tablet by mouth every 12 (twelve) hours.  . [DISCONTINUED] ibuprofen (ADVIL,MOTRIN) 200 MG tablet Take 600 mg by mouth every 6 (six) hours as needed. Neck Pain   No facility-administered encounter medications on file as of 08/27/2017.     Allergies as of 08/27/2017 - Review Complete 08/27/2017  Allergen Reaction Noted  . Codeine Other (See Comments)    . Levofloxacin  02/10/2011    Past Medical History:  Diagnosis Date  . Agatston coronary artery calcium score less than 100 2013  . Anxiety   . Environmental allergies   . GERD (gastroesophageal reflux disease)    pt hew symptoms, difficulty swallowing, lump in throat  . Gynecomastia 03/01/2012  . Headache(784.0)   . History of syncope    with low bp orthostatic onset hs  . Hx of colonic polyps    5 year recall history of rectal bleeding  . Hx of skin cancer, basal cell   . Hyperlipidemia   . Internal hemorrhoids   . Migraine   . Urinary incontinence    related to childbirth urge and stress [name] helps    Past Surgical History:  Procedure Laterality Date  . CESAREAN SECTION    . DESTRUCTION OF LESION ANAL    . DILATION AND CURETTAGE OF UTERUS     for miscarrage  . REDUCTION MAMMAPLASTY  2013    Family History  Problem Relation Age of Onset  . Hyperlipidemia Mother   . Breast cancer Mother   . Lung cancer Mother   . Depression Father   . Hypertension Father   . Other Father        tranverse mylitis  . Colon polyps Father   . Depression Brother   . Hypertension Brother   . Depression Daughter   . Anxiety disorder Daughter   . Allergies Unknown        Children  . Colon  polyps Paternal Grandmother   . Colon cancer Neg Hx        ? great uncles    Social History   Social History  . Marital status: Married    Spouse name: N/A  . Number of children: 2  . Years of education: N/A   Occupational History  . Statistician    Social History Main Topics  . Smoking status: Never Smoker  . Smokeless tobacco: Never Used  . Alcohol use 8.4 oz/week    14 Glasses of wine per week  . Drug use: No  . Sexual activity: Not on file   Other Topics Concern  . Not on file   Social History Narrative   Occupation: Production manager- Building surveyor, Masters level education, Education officer, museum 4 days per week   Married   Regular exercise- yes   HH of 4   No pets   Recently moved from  [place]   1 glass wine per night      Review of systems: Review of Systems  Constitutional: Negative for fever and chills.  HENT: Negative.   Eyes: Negative for blurred vision.  Respiratory: Negative for shortness of breath and wheezing.   positive for cough Cardiovascular: Negative for chest pain and palpitations.  Gastrointestinal: as per HPI Genitourinary: Negative for dysuria, urgency, frequency and hematuria.  Musculoskeletal: Negative for myalgias, back pain and joint pain.  Skin: Negative for itching and rash.  Neurological: Negative for dizziness, tremors, focal weakness, seizures and loss of consciousness.  Endo/Heme/Allergies: Positive for seasonal allergies.  Psychiatric/Behavioral: Negative for depression, suicidal ideas and hallucinations. . AnxietyPositive for anxiety All other systems reviewed and are negative.   Physical Exam: Vitals:   08/27/17 1437  BP: 110/82  Pulse: 80   Body mass index is 27.19 kg/m. Gen:      No acute distress HEENT:  EOMI, sclera anicteric Neck:     No masses; no thyromegaly Lungs:    Clear to auscultation bilaterally; normal respiratory effort CV:         Regular rate and rhythm; no murmurs Abd:      + bowel sounds; soft, non-tender; no palpable masses, no distension Ext:    No edema; adequate peripheral perfusion Skin:      Warm and dry; no rash Neuro: alert and oriented x 3 Psych: normal mood and affect Rectal exam: Decreased anal sphincter tone, no anal fissure or external hemorrhoids Anoscopy: Small internal hemorrhoids, no active bleeding, normal dentate line, no visible nodules  Data Reviewed:  Reviewed labs, radiology imaging, old records and pertinent past GI work up   Assessment and Plan/Recommendations:  51 year old female with history of GERD here with complaints of worsening epigastric pain for past 3-4 weeks Start omeprazole 40 mg daily, 30 minutes before breakfast Schedule for EGD to exclude severe  gastritis, peptic ulcer disease Discussed antireflux measures and lifestyle modification  Weak internal and external anal sphincter with fecal incontinence: Will refer to Earlie Counts for pelvic floor physical therapy  Intermittent diarrhea: Unlikely to be infectious based on history of intermittent episodes and the chronicity. ? Bile salt induced diarrhea  We'll do a trial of cholestyramine  Will consider hemorrhoidal band ligation if continues to have significant bright red blood per rectum and persistent symptoms from hemorrhoids   K. Denzil Magnuson , MD [phone] Mon-Fri 8a-5p [phone] after 5p, weekends, holidays  CC: Panosh, Standley Brooking, MD

## 2017-09-07 ENCOUNTER — Encounter: Payer: Self-pay | Admitting: Gastroenterology

## 2017-09-19 ENCOUNTER — Encounter: Payer: Self-pay | Admitting: Gastroenterology

## 2017-09-19 ENCOUNTER — Ambulatory Visit (AMBULATORY_SURGERY_CENTER): Payer: 59 | Admitting: Gastroenterology

## 2017-09-19 VITALS — BP 124/75 | HR 67 | Temp 97.3°F | Resp 16 | Ht 65.0 in | Wt 163.0 lb

## 2017-09-19 DIAGNOSIS — R1013 Epigastric pain: Secondary | ICD-10-CM | POA: Diagnosis present

## 2017-09-19 DIAGNOSIS — K2990 Gastroduodenitis, unspecified, without bleeding: Secondary | ICD-10-CM

## 2017-09-19 DIAGNOSIS — K2970 Gastritis, unspecified, without bleeding: Secondary | ICD-10-CM | POA: Diagnosis not present

## 2017-09-19 DIAGNOSIS — K3189 Other diseases of stomach and duodenum: Secondary | ICD-10-CM

## 2017-09-19 MED ORDER — SODIUM CHLORIDE 0.9 % IV SOLN: 500.0000 mL | INTRAVENOUS | Status: DC

## 2017-09-19 NOTE — Progress Notes (Signed)
To recovery, report to RN, VSS. 

## 2017-09-19 NOTE — Progress Notes (Deleted)
No chief complaint on file.   HPI: Tamara Watkins 51 y.o.  sda ROS: See pertinent positives and negatives per HPI.  Past Medical History:  Diagnosis Date  . Agatston coronary artery calcium score less than 100 2013  . Anxiety   . Environmental allergies   . GERD (gastroesophageal reflux disease)    pt hew symptoms, difficulty swallowing, lump in throat  . Gynecomastia 03/01/2012  . Headache(784.0)   . History of syncope    with low bp orthostatic onset hs  . Hx of colonic polyps    5 year recall history of rectal bleeding  . Hx of skin cancer, basal cell   . Hyperlipidemia   . Internal hemorrhoids   . Migraine   . Urinary incontinence    related to childbirth urge and stress [name] helps    Family History  Problem Relation Age of Onset  . Hyperlipidemia Mother   . Breast cancer Mother   . Lung cancer Mother   . Depression Father   . Hypertension Father   . Other Father        tranverse mylitis  . Colon polyps Father   . Depression Brother   . Hypertension Brother   . Depression Daughter   . Anxiety disorder Daughter   . Allergies Unknown        Children  . Colon polyps Paternal Grandmother   . Colon cancer Neg Hx        ? great uncles  . Rectal cancer Neg Hx   . Stomach cancer Neg Hx   . Esophageal cancer Neg Hx     Social History   Social History  . Marital status: Married    Spouse name: N/A  . Number of children: 2  . Years of education: N/A   Occupational History  . Statistician    Social History Main Topics  . Smoking status: Never Smoker  . Smokeless tobacco: Never Used  . Alcohol use 8.4 oz/week    14 Glasses of wine per week  . Drug use: No  . Sexual activity: Not on file   Other Topics Concern  . Not on file   Social History Narrative   Occupation: Production manager- Building surveyor, Masters level education, Education officer, museum 4 days per week   Married   Regular exercise- yes   HH of 4   No pets   Recently moved from [place]   1 glass wine per  night    Outpatient Medications Prior to Visit  Medication Sig Dispense Refill  . ALPRAZolam (XANAX) 0.25 MG tablet Take 1 tablet (0.25 mg total) by mouth 3 (three) times daily as needed for sleep. For panic attack 30 tablet 0  . cholestyramine light (PREVALITE) 4 GM/DOSE powder Take 1 packet (4 g total) by mouth daily. (Patient not taking: Reported on 09/19/2017) 239.4 g 1  . LEXAPRO 10 MG tablet Take 10 mg by mouth daily.     . Multiple Vitamin (MULTI-VITAMIN PO) Take by mouth.    Marland Kitchen omeprazole (PRILOSEC) 40 MG capsule Take 1 capsule (40 mg total) by mouth daily. 90 capsule 2  . oxybutynin (DITROPAN) 5 MG tablet Take 5 mg by mouth 2 (two) times daily.  4  . ranitidine (ZANTAC) 150 MG tablet Take 150 mg by mouth as needed.      No facility-administered medications prior to visit.      EXAM:  LMP 09/04/2017   There is no height or weight on file to  calculate BMI.  GENERAL: vitals reviewed and listed above, alert, oriented, appears well hydrated and in no acute distress HEENT: atraumatic, conjunctiva  clear, no obvious abnormalities on inspection of external nose and ears OP : no lesion edema or exudate  NECK: no obvious masses on inspection palpation  LUNGS: clear to auscultation bilaterally, no wheezes, rales or rhonchi, good air movement CV: HRRR, no clubbing cyanosis or  peripheral edema nl cap refill  MS: moves all extremities without noticeable focal  abnormality PSYCH: pleasant and cooperative, no obvious depression or anxiety  ASSESSMENT AND PLAN:  Discussed the following assessment and plan:  No diagnosis found.  -Patient advised to return or notify health care team  if symptoms worsen ,persist or new concerns arise.  There are no Patient Instructions on file for this visit.   Standley Brooking. Stevi Hollinshead M.D.

## 2017-09-19 NOTE — Op Note (Signed)
[facility] Patient Name: Tamara Watkins Procedure Date: 09/19/2017 9:54 AM MRN: [id number] Endoscopist: Mauri Pole , MD Age: 51 Referring MD:  Date of Birth: [birth year] Gender: Female Account #: [id number] Procedure:                Upper GI endoscopy Indications:              Epigastric abdominal pain Medicines:                Monitored Anesthesia Care Procedure:                Pre-Anesthesia Assessment:                           - Prior to the procedure, a History and Physical                            was performed, and patient medications and                            allergies were reviewed. The patient's tolerance of                            previous anesthesia was also reviewed. The risks                            and benefits of the procedure and the sedation                            options and risks were discussed with the patient.                            All questions were answered, and informed consent                            was obtained. Prior Anticoagulants: The patient has                            taken no previous anticoagulant or antiplatelet                            agents. ASA Grade Assessment: II - A patient with                            mild systemic disease. After reviewing the risks                            and benefits, the patient was deemed in                            satisfactory condition to undergo the procedure.                           After obtaining informed consent, the endoscope was  passed under direct vision. Throughout the                            procedure, the patient's blood pressure, pulse, and                            oxygen saturations were monitored continuously. The                            Endoscope was introduced through the mouth, and                            advanced to the second part of duodenum. The upper                            GI endoscopy was  accomplished without difficulty.                            The patient tolerated the procedure well. Scope In: Scope Out: Findings:                 The esophagus was normal.                           Patchy mild inflammation characterized by                            congestion (edema), erythema and mucus was found in                            the gastric antrum. Biopsies were taken with a cold                            forceps for Helicobacter pylori testing.                           A few 5-15 mm semi-sessile polypoid lesions were                            found in the duodenal bulb. Biopsies were taken                            with a cold forceps for histology.                           The second portion of the duodenum was normal. Complications:            No immediate complications. Estimated Blood Loss:     Estimated blood loss was minimal. Impression:               - Normal esophagus.                           - Gastritis. Biopsied.                           -  A few duodenal polyps. Biopsied.                           - Normal second portion of the duodenum. Recommendation:           - Resume previous diet.                           - Continue present medications.                           - Await pathology results. Mauri Pole, MD 09/19/2017 10:09:57 AM This report has been signed electronically.

## 2017-09-19 NOTE — Progress Notes (Signed)
Pt's states no medical or surgical changes since previsit or office visit.  No egg or soy allergy  Versed with a colon she woke up

## 2017-09-19 NOTE — Patient Instructions (Signed)
Handout given : Gastritis.   YOU HAD AN ENDOSCOPIC PROCEDURE TODAY AT [facility]:   Refer to the procedure report that was given to you for any specific questions about what was found during the examination.  If the procedure report does not answer your questions, please call your gastroenterologist to clarify.  If you requested that your care partner not be given the details of your procedure findings, then the procedure report has been included in a sealed envelope for you to review at your convenience later.  YOU SHOULD EXPECT: Some feelings of bloating in the abdomen. Passage of more gas than usual.  Walking can help get rid of the air that was put into your GI tract during the procedure and reduce the bloating. If you had a lower endoscopy (such as a colonoscopy or flexible sigmoidoscopy) you may notice spotting of blood in your stool or on the toilet paper. If you underwent a bowel prep for your procedure, you may not have a normal bowel movement for a few days.  Please Note:  You might notice some irritation and congestion in your nose or some drainage.  This is from the oxygen used during your procedure.  There is no need for concern and it should clear up in a day or so.  SYMPTOMS TO REPORT IMMEDIATELY:    Following upper endoscopy (EGD)  Vomiting of blood or coffee ground material  New chest pain or pain under the shoulder blades  Painful or persistently difficult swallowing  New shortness of breath  Fever of 100F or higher  Black, tarry-looking stools  For urgent or emergent issues, a gastroenterologist can be reached at any hour by calling [phone].   DIET:  We do recommend a small meal at first, but then you may proceed to your regular diet.  Drink plenty of fluids but you should avoid alcoholic beverages for 24 hours.  ACTIVITY:  You should plan to take it easy for the rest of today and you should NOT DRIVE or use heavy machinery until tomorrow  (because of the sedation medicines used during the test).    FOLLOW UP: Our staff will call the number listed on your records the next business day following your procedure to check on you and address any questions or concerns that you may have regarding the information given to you following your procedure. If we do not reach you, we will leave a message.  However, if you are feeling well and you are not experiencing any problems, there is no need to return our call.  We will assume that you have returned to your regular daily activities without incident.  If any biopsies were taken you will be contacted by phone or by letter within the next 1-3 weeks.  Please call us at [phone] if you have not heard about the biopsies in 3 weeks.    SIGNATURES/CONFIDENTIALITY: You and/or your care partner have signed paperwork which will be entered into your electronic medical record.  These signatures attest to the fact that that the information above on your After Visit Summary has been reviewed and is understood.  Full responsibility of the confidentiality of this discharge information lies with you and/or your care-partner.

## 2017-09-19 NOTE — Progress Notes (Signed)
Called to room to assist during endoscopic procedure.  Patient ID and intended procedure confirmed with present staff. Received instructions for my participation in the procedure from the performing physician. maw 

## 2017-09-20 ENCOUNTER — Telehealth: Payer: Self-pay | Admitting: *Deleted

## 2017-09-20 ENCOUNTER — Ambulatory Visit: Payer: 59 | Admitting: Internal Medicine

## 2017-09-20 NOTE — Telephone Encounter (Signed)
Left message on f/u call 

## 2017-09-20 NOTE — Telephone Encounter (Signed)
  Follow up Call-  Call back number 09/19/2017  Post procedure Call Back phone  # [phone]  Permission to leave phone message Yes  Some recent data might be hidden     Patient questions:  Do you have a fever, pain , or abdominal swelling? No. Pain Score  0 *  Have you tolerated food without any problems? Yes.    Have you been able to return to your normal activities? Yes.    Do you have any questions about your discharge instructions: Diet   No. Medications  No. Follow up visit  No.  Do you have questions or concerns about your Care? No.  Actions: * If pain score is 4 or above: No action needed, pain <4.

## 2017-10-05 ENCOUNTER — Encounter: Payer: Self-pay | Admitting: Gastroenterology

## 2017-10-31 ENCOUNTER — Ambulatory Visit: Payer: 59 | Admitting: Gastroenterology

## 2018-02-07 DIAGNOSIS — J301 Allergic rhinitis due to pollen: Secondary | ICD-10-CM | POA: Diagnosis not present

## 2018-02-07 DIAGNOSIS — L503 Dermatographic urticaria: Secondary | ICD-10-CM | POA: Diagnosis not present

## 2018-02-07 DIAGNOSIS — L309 Dermatitis, unspecified: Secondary | ICD-10-CM | POA: Diagnosis not present

## 2018-02-18 DIAGNOSIS — J301 Allergic rhinitis due to pollen: Secondary | ICD-10-CM | POA: Diagnosis not present

## 2018-02-18 DIAGNOSIS — L503 Dermatographic urticaria: Secondary | ICD-10-CM | POA: Diagnosis not present

## 2018-02-18 DIAGNOSIS — L309 Dermatitis, unspecified: Secondary | ICD-10-CM | POA: Diagnosis not present

## 2018-02-20 DIAGNOSIS — L503 Dermatographic urticaria: Secondary | ICD-10-CM | POA: Diagnosis not present

## 2018-02-20 DIAGNOSIS — L309 Dermatitis, unspecified: Secondary | ICD-10-CM | POA: Diagnosis not present

## 2018-02-20 DIAGNOSIS — J301 Allergic rhinitis due to pollen: Secondary | ICD-10-CM | POA: Diagnosis not present

## 2018-06-07 DIAGNOSIS — D1801 Hemangioma of skin and subcutaneous tissue: Secondary | ICD-10-CM | POA: Diagnosis not present

## 2018-06-07 DIAGNOSIS — Z85828 Personal history of other malignant neoplasm of skin: Secondary | ICD-10-CM | POA: Diagnosis not present

## 2018-06-07 DIAGNOSIS — D225 Melanocytic nevi of trunk: Secondary | ICD-10-CM | POA: Diagnosis not present

## 2018-09-25 ENCOUNTER — Encounter: Payer: Self-pay | Admitting: Internal Medicine

## 2018-09-25 ENCOUNTER — Ambulatory Visit (INDEPENDENT_AMBULATORY_CARE_PROVIDER_SITE_OTHER): Payer: 59 | Admitting: Internal Medicine

## 2018-09-25 VITALS — BP 100/62 | HR 76 | Temp 97.6°F | Ht 65.5 in | Wt 168.3 lb

## 2018-09-25 DIAGNOSIS — Z0000 Encounter for general adult medical examination without abnormal findings: Secondary | ICD-10-CM

## 2018-09-25 DIAGNOSIS — E7800 Pure hypercholesterolemia, unspecified: Secondary | ICD-10-CM

## 2018-09-25 DIAGNOSIS — F419 Anxiety disorder, unspecified: Secondary | ICD-10-CM | POA: Diagnosis not present

## 2018-09-25 DIAGNOSIS — Z79899 Other long term (current) drug therapy: Secondary | ICD-10-CM | POA: Diagnosis not present

## 2018-09-25 DIAGNOSIS — N762 Acute vulvitis: Secondary | ICD-10-CM | POA: Insufficient documentation

## 2018-09-25 DIAGNOSIS — N643 Galactorrhea not associated with childbirth: Secondary | ICD-10-CM | POA: Insufficient documentation

## 2018-09-25 DIAGNOSIS — N920 Excessive and frequent menstruation with regular cycle: Secondary | ICD-10-CM | POA: Insufficient documentation

## 2018-09-25 LAB — HEPATIC FUNCTION PANEL
ALT: 28 U/L (ref 0–35)
AST: 19 U/L (ref 0–37)
Albumin: 4.3 g/dL (ref 3.5–5.2)
Alkaline Phosphatase: 65 U/L (ref 39–117)
Bilirubin, Direct: 0.1 mg/dL (ref 0.0–0.3)
Total Bilirubin: 0.4 mg/dL (ref 0.2–1.2)
Total Protein: 6.8 g/dL (ref 6.0–8.3)

## 2018-09-25 LAB — CBC WITH DIFFERENTIAL/PLATELET
Basophils Absolute: 0 10*3/uL (ref 0.0–0.1)
Basophils Relative: 0.5 % (ref 0.0–3.0)
Eosinophils Absolute: 0.1 10*3/uL (ref 0.0–0.7)
Eosinophils Relative: 2.2 % (ref 0.0–5.0)
HCT: 41 % (ref 36.0–46.0)
Hemoglobin: 13.9 g/dL (ref 12.0–15.0)
Lymphocytes Relative: 40.7 % (ref 12.0–46.0)
Lymphs Abs: 2.2 10*3/uL (ref 0.7–4.0)
MCHC: 34 g/dL (ref 30.0–36.0)
MCV: 91.3 fl (ref 78.0–100.0)
Monocytes Absolute: 0.4 10*3/uL (ref 0.1–1.0)
Monocytes Relative: 7 % (ref 3.0–12.0)
Neutro Abs: 2.6 10*3/uL (ref 1.4–7.7)
Neutrophils Relative %: 49.6 % (ref 43.0–77.0)
Platelets: 287 10*3/uL (ref 150.0–400.0)
RBC: 4.49 Mil/uL (ref 3.87–5.11)
RDW: 13.3 % (ref 11.5–15.5)
WBC: 5.3 10*3/uL (ref 4.0–10.5)

## 2018-09-25 LAB — BASIC METABOLIC PANEL
BUN: 10 mg/dL (ref 6–23)
CO2: 28 mEq/L (ref 19–32)
Calcium: 9.1 mg/dL (ref 8.4–10.5)
Chloride: 101 mEq/L (ref 96–112)
Creatinine, Ser: 0.85 mg/dL (ref 0.40–1.20)
GFR: 75.28 mL/min (ref >60.00)
Glucose, Bld: 92 mg/dL (ref 70–99)
Potassium: 4 mEq/L (ref 3.5–5.1)
Sodium: 138 mEq/L (ref 135–145)

## 2018-09-25 LAB — LIPID PANEL
Cholesterol: 225 mg/dL — ABNORMAL HIGH (ref 0–200)
HDL: 58.1 mg/dL (ref >39.00)
LDL Cholesterol: 144 mg/dL — ABNORMAL HIGH (ref 0–99)
NonHDL: 167.18
Total CHOL/HDL Ratio: 4
Triglycerides: 115 mg/dL (ref 0.0–149.0)
VLDL: 23 mg/dL (ref 0.0–40.0)

## 2018-09-25 LAB — T4, FREE: Free T4: 0.66 ng/dL (ref 0.60–1.60)

## 2018-09-25 LAB — TSH: TSH: 1.2 u[IU]/mL (ref 0.35–4.50)

## 2018-09-25 MED ORDER — ALPRAZOLAM 0.25 MG PO TABS: 0.2500 mg | ORAL_TABLET | Freq: Three times a day (TID) | ORAL | 0 refills | Status: DC | PRN

## 2018-09-25 MED ORDER — ESCITALOPRAM OXALATE 10 MG PO TABS: 10.0000 mg | ORAL_TABLET | Freq: Every day | ORAL | 2 refills | Status: DC

## 2018-09-25 NOTE — Progress Notes (Signed)
Chief Complaint  Patient presents with  . Annual Exam    not fasting.     HPI: Patient  Tamara Watkins  52 y.o. comes in today for Preventive Health Care visit   Health Maintenance  Topic Date Due  . HIV Screening  11/03/1983  . MAMMOGRAM  03/29/2019  . PAP SMEAR  03/28/2020  . TETANUS/TDAP  09/09/2023  . INFLUENZA VACCINE  Completed   Health Maintenance Review LIFESTYLE:  Exercise:   3 x per week.  Tobacco/ETS: husband chews  Alcohol: moderate  Sugar beverages: none  Sleep: 7-8  Drug use: no HH of  4 1 dog  Work:  40     Depends .   Ditropan  ocass   Urge I helps .  rare to no use of alprazolam but needs  new refill.  Usually travel  Last rx 52 years old Wants to stay on the lexapro   Seems to help anxiety and when weaned or lower in past still feels bette on med   Zyrtec .   helps  Hives eval  Per dr Orvil Feil in past  Dust mites and    Roaches .  ROS:  GEN/ HEENT: No fever, significant weight changes sweats headaches vision problems hearing changes, CV/ PULM; No chest pain shortness of breath cough, syncope,edema  change in exercise tolerance. GI /GU: No adominal pain, vomiting, change in bowel habits. No blood in the stool. No significant GU symptoms. SKIN/HEME: ,no acute skin rashes suspicious lesions or bleeding. No lymphadenopathy, nodules, masses.  NEURO/ PSYCH:  No neurologic signs such as weakness numbness. No depression anxiety. IMM/ Allergy: No unusual infections.  Allergy .   REST of 12 system review negative except as per HPI   Past Medical History:  Diagnosis Date  . Agatston coronary artery calcium score less than 100 2013  . Anxiety   . Environmental allergies   . GERD (gastroesophageal reflux disease)    pt hew symptoms, difficulty swallowing, lump in throat  . Gynecomastia 03/01/2012  . Headache(784.0)   . History of syncope    with low bp orthostatic onset hs  . Hx of colonic polyps    5 year recall history of rectal bleeding  . Hx of skin  cancer, basal cell   . Hyperlipidemia   . Internal hemorrhoids   . Migraine   . Urinary incontinence    related to childbirth urge and stress [name] helps    Past Surgical History:  Procedure Laterality Date  . CESAREAN SECTION    . DESTRUCTION OF LESION ANAL    . DILATION AND CURETTAGE OF UTERUS     for miscarrage  . REDUCTION MAMMAPLASTY  2013    Family History  Problem Relation Age of Onset  . Hyperlipidemia Mother   . Breast cancer Mother   . Lung cancer Mother   . Depression Father   . Hypertension Father   . Other Father        tranverse mylitis  . Colon polyps Father   . Depression Brother   . Hypertension Brother   . Depression Daughter   . Anxiety disorder Daughter   . Allergies Unknown        Children  . Colon polyps Paternal Grandmother   . Colon cancer Neg Hx        ? great uncles  . Rectal cancer Neg Hx   . Stomach cancer Neg Hx   . Esophageal cancer Neg Hx     Social  History   Socioeconomic History  . Marital status: Married    Spouse name: Not on file  . Number of children: 2  . Years of education: Not on file  . Highest education level: Not on file  Occupational History  . Occupation: Statistician  Social Needs  . Financial resource strain: Not on file  . Food insecurity:    Worry: Not on file    Inability: Not on file  . Transportation needs:    Medical: Not on file    Non-medical: Not on file  Tobacco Use  . Smoking status: Never Smoker  . Smokeless tobacco: Never Used  Substance and Sexual Activity  . Alcohol use: Yes    Alcohol/week: 14.0 standard drinks    Types: 14 Glasses of wine per week  . Drug use: No  . Sexual activity: Not on file  Lifestyle  . Physical activity:    Days per week: Not on file    Minutes per session: Not on file  . Stress: Not on file  Relationships  . Social connections:    Talks on phone: Not on file    Gets together: Not on file    Attends religious service: Not on file    Active member of  club or organization: Not on file    Attends meetings of clubs or organizations: Not on file    Relationship status: Not on file  Other Topics Concern  . Not on file  Social History Narrative   Occupation: Production manager- Building surveyor, Masters level education, Education officer, museum 4 days per week   Married   Regular exercise- yes   HH of 4   No pets   Recently moved from [place]   1 glass wine per night    Outpatient Medications Prior to Visit  Medication Sig Dispense Refill  . cholestyramine light (PREVALITE) 4 GM/DOSE powder Take 1 packet (4 g total) by mouth daily. (Patient not taking: Reported on 09/19/2017) 239.4 g 1  . Multiple Vitamin (MULTI-VITAMIN PO) Take by mouth.    Marland Kitchen omeprazole (PRILOSEC) 40 MG capsule Take 1 capsule (40 mg total) by mouth daily. 90 capsule 2  . oxybutynin (DITROPAN) 5 MG tablet Take 5 mg by mouth 2 (two) times daily.  4  . ranitidine (ZANTAC) 150 MG tablet Take 150 mg by mouth as needed.     . ALPRAZolam (XANAX) 0.25 MG tablet Take 1 tablet (0.25 mg total) by mouth 3 (three) times daily as needed for sleep. For panic attack 30 tablet 0  . LEXAPRO 10 MG tablet Take 10 mg by mouth daily.      No facility-administered medications prior to visit.      EXAM:  BP 100/62 (BP Location: Right Arm, Patient Position: Sitting, Cuff Size: Normal)   Pulse 76   Temp 97.6 F (36.4 C) (Oral)   Ht 5' 5.5" (1.664 m)   Wt 168 lb 4.8 oz (76.3 kg)   BMI 27.58 kg/m   Body mass index is 27.58 kg/m. Wt Readings from Last 3 Encounters:  09/25/18 168 lb 4.8 oz (76.3 kg)  09/19/17 163 lb (73.9 kg)  08/27/17 163 lb 6 oz (74.1 kg)    Physical Exam: Vital signs reviewed WLN:LGXQ is a well-developed well-nourished alert cooperative    who appearsr stated age in no acute distress.  HEENT: normocephalic atraumatic , Eyes: PERRL EOM's full, conjunctiva clear, Nares: paten,t no deformity discharge or tenderness., Ears: no deformity EAC's clear TMs with normal landmarks.  Mouth: clear OP,  no lesions, edema.  Moist mucous membranes. Dentition in adequate repair. NECK: supple without masses, thyromegaly or bruits. CHEST/PULM:  Clear to auscultation and percussion breath sounds equal no wheeze , rales or rhonchi. No chest wall deformities or tenderness. Breast: per gyne  CV: PMI is nondisplaced, S1 S2 no gallops, murmurs, rubs. Peripheral pulses are full without delay.No JVD .  ABDOMEN: Bowel sounds normal nontender  No guard or rebound, no hepato splenomegal no CVA tenderness.  No hernia. Extremtities:  No clubbing cyanosis or edema, no acute joint swelling or redness no focal atrophy NEURO:  Oriented x3, cranial nerves 3-12 appear to be intact, no obvious focal weakness,gait within normal limits no abnormal reflexes or asymmetrical SKIN: No acute rashes normal turgor, color, no bruising or petechiae. PSYCH: Oriented, good eye contact, no obvious depression anxiety, cognition and judgment appear normal. LN: no cervical axillary inguinal adenopathy  Lab Results  Component Value Date   WBC 4.9 06/28/2016   HGB 12.9 06/28/2016   HCT 38.1 06/28/2016   PLT 342.0 06/28/2016   GLUCOSE 86 06/28/2016   CHOL 225 (H) 06/28/2016   TRIG 131.0 06/28/2016   HDL 57.80 06/28/2016   LDLDIRECT 139.3 09/04/2013   LDLCALC 141 (H) 06/28/2016   ALT 14 06/28/2016   AST 14 06/28/2016   NA 140 06/28/2016   K 4.2 06/28/2016   CL 105 06/28/2016   CREATININE 0.79 06/28/2016   BUN 9 06/28/2016   CO2 22 06/28/2016   TSH 1.37 06/28/2016    BP Readings from Last 3 Encounters:  09/25/18 100/62  09/19/17 124/75  08/27/17 110/82    Lab  plan reviewed with patient   ASSESSMENT AND PLAN:  Discussed the following assessment and plan:  Visit for preventive health examination - Plan: Basic metabolic panel, CBC with Differential/Platelet, Hepatic function panel, Lipid panel, TSH, T4, free  Elevated cholesterol - Plan: Basic metabolic panel, CBC with Differential/Platelet, Hepatic function panel,  Lipid panel, TSH, T4, free  Medication management - Plan: Basic metabolic panel, CBC with Differential/Platelet, Hepatic function panel, Lipid panel, TSH, T4, free  Anxiety - Plan: ALPRAZolam (XANAX) 0.25 MG tablet Benefit more than risk of medications  to continue. Will  rx lexapro today ( dr Harrington Challenger in past)  Continue lifestyle intervention healthy eating and exercise .  Patient Care Team: Panosh, Standley Brooking, MD as PCP - General kortesis (Plastic Surgery) Vanessa Kick, MD as Consulting Physician (Obstetrics and Gynecology) Martinique, Amy, MD as Consulting Physician (Dermatology) Mauri Pole, MD as Consulting Physician (Gastroenterology) Tiajuana Amass, MD as Referring Physician (Allergy and Immunology) Patient Instructions      Preventive Care 40-64 Years, Female Preventive care refers to lifestyle choices and visits with your health care provider that can promote health and wellness. What does preventive care include?  A yearly physical exam. This is also called an annual well check.  Dental exams once or twice a year.  Routine eye exams. Ask your health care provider how often you should have your eyes checked.  Personal lifestyle choices, including: ? Daily care of your teeth and gums. ? Regular physical activity. ? Eating a healthy diet. ? Avoiding tobacco and drug use. ? Limiting alcohol use. ? Practicing safe sex. ? Taking low-dose aspirin daily starting at age 31. ? Taking vitamin and mineral supplements as recommended by your health care provider. What happens during an annual well check? The services and screenings done by your health care provider during your annual well check will  depend on your age, overall health, lifestyle risk factors, and family history of disease. Counseling Your health care provider may ask you questions about your:  Alcohol use.  Tobacco use.  Drug use.  Emotional well-being.  Home and relationship well-being.  Sexual  activity.  Eating habits.  Work and work Statistician.  Method of birth control.  Menstrual cycle.  Pregnancy history.  Screening You may have the following tests or measurements:  Height, weight, and BMI.  Blood pressure.  Lipid and cholesterol levels. These may be checked every 5 years, or more frequently if you are over 70 years old.  Skin check.  Lung cancer screening. You may have this screening every year starting at age 62 if you have a 30-pack-year history of smoking and currently smoke or have quit within the past 15 years.  Fecal occult blood test (FOBT) of the stool. You may have this test every year starting at age 92.  Flexible sigmoidoscopy or colonoscopy. You may have a sigmoidoscopy every 5 years or a colonoscopy every 10 years starting at age 47.  Hepatitis C blood test.  Hepatitis B blood test.  Sexually transmitted disease (STD) testing.  Diabetes screening. This is done by checking your blood sugar (glucose) after you have not eaten for a while (fasting). You may have this done every 1-3 years.  Mammogram. This may be done every 1-2 years. Talk to your health care provider about when you should start having regular mammograms. This may depend on whether you have a family history of breast cancer.  BRCA-related cancer screening. This may be done if you have a family history of breast, ovarian, tubal, or peritoneal cancers.  Pelvic exam and Pap test. This may be done every 3 years starting at age 38. Starting at age 19, this may be done every 5 years if you have a Pap test in combination with an HPV test.  Bone density scan. This is done to screen for osteoporosis. You may have this scan if you are at high risk for osteoporosis.  Discuss your test results, treatment options, and if necessary, the need for more tests with your health care provider. Vaccines Your health care provider may recommend certain vaccines, such as:  Influenza vaccine. This is  recommended every year.  Tetanus, diphtheria, and acellular pertussis (Tdap, Td) vaccine. You may need a Td booster every 10 years.  Varicella vaccine. You may need this if you have not been vaccinated.  Zoster vaccine. You may need this after age 72.  Measles, mumps, and rubella (MMR) vaccine. You may need at least one dose of MMR if you were born in 1957 or later. You may also need a second dose.  Pneumococcal 13-valent conjugate (PCV13) vaccine. You may need this if you have certain conditions and were not previously vaccinated.  Pneumococcal polysaccharide (PPSV23) vaccine. You may need one or two doses if you smoke cigarettes or if you have certain conditions.  Meningococcal vaccine. You may need this if you have certain conditions.  Hepatitis A vaccine. You may need this if you have certain conditions or if you travel or work in places where you may be exposed to hepatitis A.  Hepatitis B vaccine. You may need this if you have certain conditions or if you travel or work in places where you may be exposed to hepatitis B.  Haemophilus influenzae type b (Hib) vaccine. You may need this if you have certain conditions.  Talk to your health care provider about which  screenings and vaccines you need and how often you need them. This information is not intended to replace advice given to you by your health care provider. Make sure you discuss any questions you have with your health care provider. Document Released: 12/10/2015 Document Revised: 08/02/2016 Document Reviewed: 09/14/2015 Elsevier Interactive Patient Education  2018 [place]. Panosh M.D.

## 2018-09-25 NOTE — Patient Instructions (Signed)
Preventive Care 40-64 Years, Female Preventive care refers to lifestyle choices and visits with your health care provider that can promote health and wellness. What does preventive care include?  A yearly physical exam. This is also called an annual well check.  Dental exams once or twice a year.  Routine eye exams. Ask your health care provider how often you should have your eyes checked.  Personal lifestyle choices, including: ? Daily care of your teeth and gums. ? Regular physical activity. ? Eating a healthy diet. ? Avoiding tobacco and drug use. ? Limiting alcohol use. ? Practicing safe sex. ? Taking low-dose aspirin daily starting at age 50. ? Taking vitamin and mineral supplements as recommended by your health care provider. What happens during an annual well check? The services and screenings done by your health care provider during your annual well check will depend on your age, overall health, lifestyle risk factors, and family history of disease. Counseling Your health care provider may ask you questions about your:  Alcohol use.  Tobacco use.  Drug use.  Emotional well-being.  Home and relationship well-being.  Sexual activity.  Eating habits.  Work and work environment.  Method of birth control.  Menstrual cycle.  Pregnancy history.  Screening You may have the following tests or measurements:  Height, weight, and BMI.  Blood pressure.  Lipid and cholesterol levels. These may be checked every 5 years, or more frequently if you are over 50 years old.  Skin check.  Lung cancer screening. You may have this screening every year starting at age 55 if you have a 30-pack-year history of smoking and currently smoke or have quit within the past 15 years.  Fecal occult blood test (FOBT) of the stool. You may have this test every year starting at age 50.  Flexible sigmoidoscopy or colonoscopy. You may have a sigmoidoscopy every 5 years or a colonoscopy  every 10 years starting at age 50.  Hepatitis C blood test.  Hepatitis B blood test.  Sexually transmitted disease (STD) testing.  Diabetes screening. This is done by checking your blood sugar (glucose) after you have not eaten for a while (fasting). You may have this done every 1-3 years.  Mammogram. This may be done every 1-2 years. Talk to your health care provider about when you should start having regular mammograms. This may depend on whether you have a family history of breast cancer.  BRCA-related cancer screening. This may be done if you have a family history of breast, ovarian, tubal, or peritoneal cancers.  Pelvic exam and Pap test. This may be done every 3 years starting at age 21. Starting at age 30, this may be done every 5 years if you have a Pap test in combination with an HPV test.  Bone density scan. This is done to screen for osteoporosis. You may have this scan if you are at high risk for osteoporosis.  Discuss your test results, treatment options, and if necessary, the need for more tests with your health care provider. Vaccines Your health care provider may recommend certain vaccines, such as:  Influenza vaccine. This is recommended every year.  Tetanus, diphtheria, and acellular pertussis (Tdap, Td) vaccine. You may need a Td booster every 10 years.  Varicella vaccine. You may need this if you have not been vaccinated.  Zoster vaccine. You may need this after age 60.  Measles, mumps, and rubella (MMR) vaccine. You may need at least one dose of MMR if you were born in   1955 or later. You may also need a second dose.  Pneumococcal 13-valent conjugate (PCV13) vaccine. You may need this if you have certain conditions and were not previously vaccinated.  Pneumococcal polysaccharide (PPSV23) vaccine. You may need one or two doses if you smoke cigarettes or if you have certain conditions.  Meningococcal vaccine. You may need this if you have certain  conditions.  Hepatitis A vaccine. You may need this if you have certain conditions or if you travel or work in places where you may be exposed to hepatitis A.  Hepatitis B vaccine. You may need this if you have certain conditions or if you travel or work in places where you may be exposed to hepatitis B.  Haemophilus influenzae type b (Hib) vaccine. You may need this if you have certain conditions.  Talk to your health care provider about which screenings and vaccines you need and how often you need them. This information is not intended to replace advice given to you by your health care provider. Make sure you discuss any questions you have with your health care provider. Document Released: 12/10/2015 Document Revised: 08/02/2016 Document Reviewed: 09/14/2015 Elsevier Interactive Patient Education  2018 [employer] Inc[employer]  

## 2018-10-22 DIAGNOSIS — Z124 Encounter for screening for malignant neoplasm of cervix: Secondary | ICD-10-CM | POA: Diagnosis not present

## 2018-10-22 DIAGNOSIS — Z1231 Encounter for screening mammogram for malignant neoplasm of breast: Secondary | ICD-10-CM | POA: Diagnosis not present

## 2018-10-22 DIAGNOSIS — Z01419 Encounter for gynecological examination (general) (routine) without abnormal findings: Secondary | ICD-10-CM | POA: Diagnosis not present

## 2018-11-07 DIAGNOSIS — J029 Acute pharyngitis, unspecified: Secondary | ICD-10-CM | POA: Diagnosis not present

## 2018-11-07 DIAGNOSIS — R509 Fever, unspecified: Secondary | ICD-10-CM | POA: Diagnosis not present

## 2018-11-07 DIAGNOSIS — J069 Acute upper respiratory infection, unspecified: Secondary | ICD-10-CM | POA: Diagnosis not present

## 2018-11-27 DIAGNOSIS — C801 Malignant (primary) neoplasm, unspecified: Secondary | ICD-10-CM

## 2018-11-27 HISTORY — DX: Malignant (primary) neoplasm, unspecified: C80.1

## 2019-02-11 ENCOUNTER — Telehealth: Payer: Self-pay

## 2019-02-11 NOTE — Telephone Encounter (Signed)
Covid-19 travel screening questions  Have you traveled in the last 14 days? No If yes where?   Do you now or have you had a fever in the last 14 days? No  Do you have any respiratory symptoms of shortness of breath or cough now or in the last 14 days? No  Do you have a medical history of Congestive Heart Failure? No  Do you have a medical history of lung disease? No  Do you have any family members or close contacts with diagnosed or suspected Covid-19? No        

## 2019-02-12 ENCOUNTER — Encounter: Payer: Self-pay | Admitting: Physician Assistant

## 2019-02-12 ENCOUNTER — Other Ambulatory Visit (INDEPENDENT_AMBULATORY_CARE_PROVIDER_SITE_OTHER): Payer: 59

## 2019-02-12 ENCOUNTER — Ambulatory Visit (INDEPENDENT_AMBULATORY_CARE_PROVIDER_SITE_OTHER): Payer: 59 | Admitting: Physician Assistant

## 2019-02-12 ENCOUNTER — Other Ambulatory Visit: Payer: Self-pay

## 2019-02-12 VITALS — BP 104/60 | HR 76 | Temp 97.9°F | Ht 66.0 in | Wt 173.0 lb

## 2019-02-12 DIAGNOSIS — R197 Diarrhea, unspecified: Secondary | ICD-10-CM

## 2019-02-12 DIAGNOSIS — K625 Hemorrhage of anus and rectum: Secondary | ICD-10-CM

## 2019-02-12 LAB — CBC WITH DIFFERENTIAL/PLATELET
Basophils Absolute: 0 10*3/uL (ref 0.0–0.1)
Basophils Relative: 0.4 % (ref 0.0–3.0)
Eosinophils Absolute: 0.1 10*3/uL (ref 0.0–0.7)
Eosinophils Relative: 2.6 % (ref 0.0–5.0)
HCT: 40.1 % (ref 36.0–46.0)
Hemoglobin: 13.6 g/dL (ref 12.0–15.0)
Lymphocytes Relative: 50.1 % — ABNORMAL HIGH (ref 12.0–46.0)
Lymphs Abs: 2.8 10*3/uL (ref 0.7–4.0)
MCHC: 33.9 g/dL (ref 30.0–36.0)
MCV: 90.6 fl (ref 78.0–100.0)
Monocytes Absolute: 0.3 10*3/uL (ref 0.1–1.0)
Monocytes Relative: 5 % (ref 3.0–12.0)
Neutro Abs: 2.3 10*3/uL (ref 1.4–7.7)
Neutrophils Relative %: 41.9 % — ABNORMAL LOW (ref 43.0–77.0)
Platelets: 298 10*3/uL (ref 150.0–400.0)
RBC: 4.42 Mil/uL (ref 3.87–5.11)
RDW: 13.8 % (ref 11.5–15.5)
WBC: 5.6 10*3/uL (ref 4.0–10.5)

## 2019-02-12 LAB — C-REACTIVE PROTEIN: CRP: <1 mg/dL (ref 0.5–20.0)

## 2019-02-12 LAB — SEDIMENTATION RATE: Sed Rate: 2 mm/hr (ref 0–30)

## 2019-02-12 MED ORDER — SUPREP BOWEL PREP KIT 17.5-3.13-1.6 GM/177ML PO SOLN: ORAL | 0 refills | Status: DC

## 2019-02-12 NOTE — Patient Instructions (Signed)
To help prevent the possible spread of infection to our patients, communities, and staff; we will be implementing the following measures:  Please only allow one visitor/family member to accompany you to any upcoming appointments with [facility]. If you have any concerns about this please contact our office to discuss prior to the appointment.   If you are age 51 or older, your body mass index should be between 23-30. Your Body mass index is 27.92 kg/m. If this is out of the aforementioned range listed, please consider follow up with your Primary Care Provider.  If you are age 36 or younger, your body mass index should be between 19-25. Your Body mass index is 27.92 kg/m. If this is out of the aformentioned range listed, please consider follow up with your Primary Care Provider.   Please go to the lab in the basement of our building to have lab work done as you leave today. Hit "B" for basement when you get on the elevator.  When the doors open the lab is on your left.  We will call you with the results. Thank you.  You have been scheduled for a colonoscopy. Please follow written instructions given to you at your visit today.  Please pick up your prep supplies at the pharmacy within the next 1-3 days. If you use inhalers (even only as needed), please bring them with you on the day of your procedure. Your physician has requested that you go to www.startemmi.com and enter the access code given to you at your visit today. This web site gives a general overview about your procedure. However, you should still follow specific instructions given to you by our office regarding your preparation for the procedure.  Thank you for entrusting me with your care and for choosing [facility], [place], [place]

## 2019-02-12 NOTE — Progress Notes (Signed)
Subjective:    Patient ID: Tamara Watkins, female    DOB: 1966-08-08, 53 y.o.   MRN: [id number]  HPI Tamara Watkins is a pleasant 53 year old white female, list with Dr. Silverio Decamp  who was last seen in our office in October 2018.  She has had prior colonoscopy in January 2015 per Dr. Delfin Edis which was done for screening and was normal. She had EGD in October 2018 with finding of 5 to 15 mm polypoid lesions in the duodenal bulb.  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 pylori. She comes in today with complaints of ongoing issues with diarrhea over the past year.  She says she feels she has had some IBS symptoms in the past and has always been somewhat susceptible to diarrhea however over the past year she has having episodes of diarrhea at least every couple of days.  She may have a bad day with several bowel movements, have a normal bowel movement the following day and then goes back to loose stools or diarrhea.  She is having many more unformed stools than formed stools.  She is also been noticing red blood mixed in with her bowel movements intermittently over the past several months.  She says usually this is streaked blood mixed in with the stool.  She has no complaints of abdominal pain or rectal pain.  Recently the bleeding has become an almost daily occurrence.. She says she seems to feel best if she eats primarily a diet of bread and cheese which seems to help the diarrhea.  She says if she goes on more of a keto diet symptoms are worse.  She has not had any lactose intolerance though she says if she eats very heavy fatty type foods that will aggravate the diarrhea.  She drinks one diet Coke per day otherwise no artificial sweeteners. Family history is pertinent for polyps in her father, no celiac disease or IBD.  Review of Systems Pertinent positive and negative review of systems were noted in the above HPI  section.  All other review of systems was otherwise negative.  Outpatient Encounter Medications as of 02/12/2019  Medication Sig  . ALPRAZolam (XANAX) 0.25 MG tablet Take 1 tablet (0.25 mg total) by mouth 3 (three) times daily as needed for sleep. For panic attack  . escitalopram (LEXAPRO) 10 MG tablet Take 1 tablet (10 mg total) by mouth daily.  . fluticasone (FLONASE) 50 MCG/ACT nasal spray Place 2 sprays into both nostrils daily as needed for allergies or rhinitis.  Marland Kitchen levocetirizine (XYZAL) 5 MG tablet Take 5 mg by mouth every evening.  . Multiple Vitamin (MULTI-VITAMIN PO) Take by mouth.  Marland Kitchen omeprazole (PRILOSEC) 40 MG capsule Take 40 mg by mouth daily as needed.  Marland Kitchen oxybutynin (DITROPAN) 5 MG tablet Take 5 mg by mouth daily as needed.   Manus Gunning BOWEL PREP KIT 17.5-3.13-1.6 GM/177ML SOLN Suprep-Use as directed  . [DISCONTINUED] cholestyramine light (PREVALITE) 4 GM/DOSE powder Take 1 packet (4 g total) by mouth daily. (Patient not taking: Reported on 09/19/2017)  . [DISCONTINUED] omeprazole (PRILOSEC) 40 MG capsule Take 1 capsule (40 mg total) by mouth daily. (Patient taking differently: Take 40 mg by mouth daily as needed. )  . [DISCONTINUED] ranitidine (ZANTAC) 150 MG tablet Take 150 mg by mouth as needed.    No facility-administered encounter medications on file as of 02/12/2019.    Allergies  Allergen Reactions  .  Codeine Other (See Comments)    Abdominal pain  . Levofloxacin     Sob and dizziness   Patient Active Problem List   Diagnosis Date Noted  . Galactorrhea not associated with childbirth 09/25/2018  . Menorrhagia 09/25/2018  . Vulvitis 09/25/2018  . Breakthrough bleeding mid cycle  01/27/2015  . Irregular menstrual cycle 06/08/2014  . Weight gain 06/08/2014  . Hoarseness 06/08/2014  . Sore throat 06/08/2014  . Sleep disturbance 06/08/2014  . Abnormal skin color 10/30/2012  . Agatston coronary artery calcium score less than 100   . Neck pain, chronic 03/01/2012  .  Chronic upper back pain 03/01/2012  . Allergic rhinitis, seasonal 03/01/2012  . Cervicogenic migraine 03/01/2012  . Encounter for preventive health examination 05/22/2011  . Goiter 04/28/2011  . POSTNASAL DRIP SYNDROME 08/05/2010  . CERVICAL LYMPHADENOPATHY, RIGHT 08/05/2010  . FATIGUE 04/09/2009  . Hyperlipidemia 03/09/2009  . PANIC DISORDER 03/09/2009  . URINARY INCONTINENCE 03/09/2009  . SKIN CANCER, HX OF 03/09/2009  . COLONIC POLYPS, HX OF 03/09/2009   Social History   Socioeconomic History  . Marital status: Married    Spouse name: Not on file  . Number of children: 2  . Years of education: Not on file  . Highest education level: Not on file  Occupational History  . Occupation: Statistician  Social Needs  . Financial resource strain: Not on file  . Food insecurity:    Worry: Not on file    Inability: Not on file  . Transportation needs:    Medical: Not on file    Non-medical: Not on file  Tobacco Use  . Smoking status: Never Smoker  . Smokeless tobacco: Never Used  Substance and Sexual Activity  . Alcohol use: Yes    Alcohol/week: 14.0 standard drinks    Types: 14 Glasses of wine per week  . Drug use: No  . Sexual activity: Not on file  Lifestyle  . Physical activity:    Days per week: Not on file    Minutes per session: Not on file  . Stress: Not on file  Relationships  . Social connections:    Talks on phone: Not on file    Gets together: Not on file    Attends religious service: Not on file    Active member of club or organization: Not on file    Attends meetings of clubs or organizations: Not on file    Relationship status: Not on file  . Intimate partner violence:    Fear of current or ex partner: Not on file    Emotionally abused: Not on file    Physically abused: Not on file    Forced sexual activity: Not on file  Other Topics Concern  . Not on file  Social History Narrative   Occupation: Production manager- Building surveyor, Masters level education, Education officer, museum  4 days per week   Married   Regular exercise- yes   HH of 4   No pets   Recently moved from [place]   1 glass wine per night    Tamara Watkins's family history includes Allergies in an other family member; Anxiety disorder in her daughter; Breast cancer in her mother; Colon polyps in her father and paternal grandmother; Depression in her brother, daughter, and father; Hyperlipidemia in her mother; Hypertension in her brother and father; Lung cancer in her mother; Other in her father; Suicidality in her father.      Objective:    Vitals:   02/12/19  0907  BP: 104/60  Pulse: 76  Temp: 97.9 F (36.6 C)    Physical Exam; well-developed white female in no acute distress, pleasant, height 5 foot 6, weight 173, BMI 27.9.  HEENT ;nontraumatic normocephalic EOMI PERRLA sclera anicteric, Oral mucosa moist, Cardiovascular; regular rate and rhythm with S1-S2 no murmur rub or gallop, Pulmonary; clear bilaterally.  Abdomen; soft, has some mild bilateral lower quadrant tenderness there is no guarding or rebound, bowel sounds are present, no palpable mass or hepatosplenomegaly, Rectal ;exam not done, Extremities; no clubbing cyanosis or edema skin warm and dry, Neuropsych; alert and oriented, grossly nonfocal mood and affect appropriate       Assessment & Plan:   #29 53 year old white female with almost 1 year history of very frequent diarrhea, and hematochezia with streaks of bright red blood noted in stool on a very regular basis over the past few months. She is not having any significant abdominal pain or cramping, and no anorectal symptoms. Rule out occult colon lesion, rule out new onset IBD, rule out IBS with caseous secondary to internal hemorrhoids though not noted on prior colonoscopy.  #2 gastric heterotopia noted on EGD October 2018 with small polypoid duodenal bulb lesions. #3.  History of migraines #4.  Anxiety  Plan;Will check CBC with differential, sed rate and CRP , Patient  will be scheduled for Colonoscopy with Dr. Silverio Decamp.  Procedure was discussed in detail with the patient including indications risks and benefits and she is agreeable to proceed. Further recommendations pending findings at Colonoscopy.  Amy Genia Harold PA-C 02/12/2019   Cc: Burnis Medin, MD

## 2019-02-18 NOTE — Progress Notes (Signed)
Reviewed and agree with documentation and assessment and plan. K. Veena Gailen Venne , MD   

## 2019-02-20 ENCOUNTER — Telehealth: Payer: Self-pay

## 2019-02-20 NOTE — Telephone Encounter (Signed)
Covid-19 travel screening questions  Have you traveled in the last 14 days? No. If yes where?  Do you now or have you had a fever in the last 14 days? No.  Do you have any respiratory symptoms of shortness of breath or cough now or in the last 14 days? No.  Do you have a medical history of Congestive Heart Failure?  Do you have a medical history of lung disease?  Do you have any family members or close contacts with diagnosed or suspected Covid-19? No.    Called and talked to patient about our "No care partner in the lobby" policy and went over our screening questions. She agreed and had no further questions.

## 2019-02-21 ENCOUNTER — Encounter: Payer: Self-pay | Admitting: Gastroenterology

## 2019-02-21 ENCOUNTER — Ambulatory Visit (AMBULATORY_SURGERY_CENTER): Payer: 59 | Admitting: Gastroenterology

## 2019-02-21 ENCOUNTER — Other Ambulatory Visit: Payer: Self-pay

## 2019-02-21 VITALS — BP 110/70 | HR 72 | Temp 98.4°F | Resp 11 | Ht 66.0 in | Wt 173.0 lb

## 2019-02-21 DIAGNOSIS — D127 Benign neoplasm of rectosigmoid junction: Secondary | ICD-10-CM

## 2019-02-21 DIAGNOSIS — R197 Diarrhea, unspecified: Secondary | ICD-10-CM | POA: Diagnosis not present

## 2019-02-21 DIAGNOSIS — D122 Benign neoplasm of ascending colon: Secondary | ICD-10-CM

## 2019-02-21 DIAGNOSIS — K921 Melena: Secondary | ICD-10-CM

## 2019-02-21 DIAGNOSIS — C19 Malignant neoplasm of rectosigmoid junction: Secondary | ICD-10-CM

## 2019-02-21 DIAGNOSIS — K640 First degree hemorrhoids: Secondary | ICD-10-CM | POA: Diagnosis not present

## 2019-02-21 DIAGNOSIS — K625 Hemorrhage of anus and rectum: Secondary | ICD-10-CM | POA: Diagnosis not present

## 2019-02-21 MED ORDER — SODIUM CHLORIDE 0.9 % IV SOLN: 500.0000 mL | Freq: Once | INTRAVENOUS | Status: DC

## 2019-02-21 NOTE — Patient Instructions (Signed)
YOU HAD AN ENDOSCOPIC PROCEDURE TODAY AT [facility]:   Refer to the procedure report that was given to you for any specific questions about what was found during the examination.  If the procedure report does not answer your questions, please call your gastroenterologist to clarify.  If you requested that your care partner not be given the details of your procedure findings, then the procedure report has been included in a sealed envelope for you to review at your convenience later.  YOU SHOULD EXPECT: Some feelings of bloating in the abdomen. Passage of more gas than usual.  Walking can help get rid of the air that was put into your GI tract during the procedure and reduce the bloating. If you had a lower endoscopy (such as a colonoscopy or flexible sigmoidoscopy) you may notice spotting of blood in your stool or on the toilet paper. If you underwent a bowel prep for your procedure, you may not have a normal bowel movement for a few days.  Please Note:  You might notice some irritation and congestion in your nose or some drainage.  This is from the oxygen used during your procedure.  There is no need for concern and it should clear up in a day or so.  SYMPTOMS TO REPORT IMMEDIATELY:   Following lower endoscopy (colonoscopy or flexible sigmoidoscopy):  Excessive amounts of blood in the stool  Significant tenderness or worsening of abdominal pains  Swelling of the abdomen that is new, acute  Fever of 100F or higher  Please see handouts given to you on Polyps, Hemorrhoids and High Fiber diet. You will have a repeat Colonoscopy in 6 months to check on the Polyp site in the sigmoid colon.  Use fiber, for example Citrucel, Fibercon, Konsyl or Metamucil daily.  For urgent or emergent issues, a gastroenterologist can be reached at any hour by calling [phone].   DIET:  We do recommend a small meal at first, but then you may proceed to your regular diet.  Drink plenty of fluids  but you should avoid alcoholic beverages for 24 hours.  ACTIVITY:  You should plan to take it easy for the rest of today and you should NOT DRIVE or use heavy machinery until tomorrow (because of the sedation medicines used during the test).    FOLLOW UP: Our staff will call the number listed on your records the next business day following your procedure to check on you and address any questions or concerns that you may have regarding the information given to you following your procedure. If we do not reach you, we will leave a message.  However, if you are feeling well and you are not experiencing any problems, there is no need to return our call.  We will assume that you have returned to your regular daily activities without incident.  If any biopsies were taken you will be contacted by phone or by letter within the next 1-3 weeks.  Please call us at [phone] if you have not heard about the biopsies in 3 weeks.    SIGNATURES/CONFIDENTIALITY: You and/or your care partner have signed paperwork which will be entered into your electronic medical record.  These signatures attest to the fact that that the information above on your After Visit Summary has been reviewed and is understood.  Full responsibility of the confidentiality of this discharge information lies with you and/or your care-partner.  Thank you for letting us take care of your healthcare needs today.

## 2019-02-21 NOTE — Progress Notes (Signed)
Called to room to assist during endoscopic procedure.  Patient ID and intended procedure confirmed with present staff. Received instructions for my participation in the procedure from the performing physician.  

## 2019-02-21 NOTE — Progress Notes (Signed)
To PACU, VSS. Report to [email or web address] 

## 2019-02-21 NOTE — Progress Notes (Signed)
History reviewed today 

## 2019-02-21 NOTE — Op Note (Signed)
[facility] Patient Name: Tamara Watkins Procedure Date: 02/21/2019 11:57 AM MRN: [id number] Endoscopist: Gerrit Heck , MD Age: 53 Referring MD:  Date of Birth: 1966/06/08 Gender: Female Account #: [id number] Procedure:                Colonoscopy Indications:              Hematochezia, Diarrhea Medicines:                Monitored Anesthesia Care Procedure:                Pre-Anesthesia Assessment:                           - Prior to the procedure, a History and Physical                            was performed, and patient medications and                            allergies were reviewed. The patient's tolerance of                            previous anesthesia was also reviewed. The risks                            and benefits of the procedure and the sedation                            options and risks were discussed with the patient.                            All questions were answered, and informed consent                            was obtained. Prior Anticoagulants: The patient has                            taken no previous anticoagulant or antiplatelet                            agents. ASA Grade Assessment: II - A patient with                            mild systemic disease. After reviewing the risks                            and benefits, the patient was deemed in                            satisfactory condition to undergo the procedure.                           After obtaining informed consent, the colonoscope  was passed under direct vision. Throughout the                            procedure, the patient's blood pressure, pulse, and                            oxygen saturations were monitored continuously. The                            Colonoscope was introduced through the anus and                            advanced to the the terminal ileum. The colonoscopy                            was performed without difficulty. The  patient                            tolerated the procedure well. The quality of the                            bowel preparation was adequate. The terminal ileum,                            ileocecal valve, appendiceal orifice, and rectum                            were photographed. Scope In: 12:17:17 PM Scope Out: 12:53:34 PM Scope Withdrawal Time: 0 hours 33 minutes 35 seconds  Total Procedure Duration: 0 hours 36 minutes 17 seconds  Findings:                 Hemorrhoids were found on perianal exam.                           Two sessile polyps were found in the ascending                            colon. The polyps were 2 to 3 mm in size. These                            polyps were removed with a cold snare and cold                            forceps. Resection and retrieval were complete.                            Estimated blood loss was minimal.                           A 25 mm polyp was found in the recto-sigmoid colon.                            The polyp was semi-pedunculated. The  polyp was                            removed with a hot snare. Resection and retrieval                            were complete via piecemeal technique. The                            surrounding tissue and edges was then treated with                            the tip of the snare with hot coag for tissue                            fulguration. Area 4 cm proximal to the polypectomy                            site was tattooed with an injection of 2 mL of Spot                            (carbon black). Estimated blood loss was minimal.                           Normal mucosa was otherwise found in the rectum, in                            the sigmoid colon, in the descending colon, in the                            transverse colon, in the ascending colon and in the                            cecum. Biopsies for histology were taken with a                            cold forceps from the right colon  and left colon                            for evaluation of microscopic colitis. Estimated                            blood loss was minimal.                           Non-bleeding internal hemorrhoids were found during                            retroflexion. The hemorrhoids were small.                           The terminal ileum appeared normal. Complications:            No  immediate complications. Estimated Blood Loss:     Estimated blood loss was minimal. Impression:               - Hemorrhoids found on perianal exam.                           - Two 2 to 3 mm polyps in the ascending colon,                            removed with a cold snare. Resected and retrieved.                           - One 25 mm polyp at the recto-sigmoid colon,                            removed with a hot snare. Resected and retrieved.                            Tattooed.                           - Normal mucosa in the rectum, in the sigmoid                            colon, in the descending colon, in the transverse                            colon, in the ascending colon and in the cecum.                            Biopsied.                           - Non-bleeding internal hemorrhoids.                           - The examined portion of the ileum was normal. Recommendation:           - Patient has a contact number available for                            emergencies. The signs and symptoms of potential                            delayed complications were discussed with the                            patient. Return to normal activities tomorrow.                            Written discharge instructions were provided to the                            patient.                           -  Resume previous diet today.                           - Continue present medications.                           - Await pathology results.                           - Repeat colonoscopy in 6 months for surveillance                             after piecemeal polypectomy.                           - Return to GI clinic PRN.                           - Use fiber, for example Citrucel, Fibercon, Konsyl                            or Metamucil. Gerrit Heck, MD 02/21/2019 1:06:52 PM

## 2019-02-24 ENCOUNTER — Telehealth: Payer: Self-pay | Admitting: *Deleted

## 2019-02-24 NOTE — Telephone Encounter (Signed)
  Follow up Call-  Call back number 02/21/2019 09/19/2017  Post procedure Call Back phone  # [phone]  Permission to leave phone message Yes Yes  Some recent data might be hidden     Patient questions:  Do you have a fever, pain , or abdominal swelling? No. Pain Score  0 *  Have you tolerated food without any problems? Yes.    Have you been able to return to your normal activities? Yes.    Do you have any questions about your discharge instructions: Diet   No. Medications  No. Follow up visit  No.  Do you have questions or concerns about your Care? No.  Actions: * If pain score is 4 or above: No action needed, pain <4.

## 2019-06-11 ENCOUNTER — Ambulatory Visit (INDEPENDENT_AMBULATORY_CARE_PROVIDER_SITE_OTHER): Payer: 59 | Admitting: Internal Medicine

## 2019-06-11 ENCOUNTER — Encounter: Payer: Self-pay | Admitting: Internal Medicine

## 2019-06-11 DIAGNOSIS — J329 Chronic sinusitis, unspecified: Secondary | ICD-10-CM | POA: Diagnosis not present

## 2019-06-11 DIAGNOSIS — Z0289 Encounter for other administrative examinations: Secondary | ICD-10-CM

## 2019-06-11 MED ORDER — AMOXICILLIN-POT CLAVULANATE 875-125 MG PO TABS: 1.0000 | ORAL_TABLET | Freq: Two times a day (BID) | ORAL | 0 refills | Status: DC

## 2019-06-11 NOTE — Progress Notes (Signed)
Virtual Visit via Video Note  I connected with@ on 06/11/19 at  4:30 PM EDT by a video enabled telemedicine application and verified that I am speaking with the correct person using two identifiers. Location patient: home Location provider:work  office Persons participating in the virtual visit: patient, provider  [place]th national recommendations  regarding COVID 19 pandemic   video visit is advised over in office visit for this patient.  Patient aware  of the limitations of evaluation and management by telemedicine and  availability of in person appointments. and agreed to proceed.   HPI: Tamara Watkins presents for video visit  For concern   about sinus infection.  Has had pnd for about 5 weeks and then over past 2 weeks progressive HA and face pain left cheek area tender for the most par  And ha not responding to allergy and otc sinus meds . advil type.   No fever   Sig cough ( drainage cough) and  No neuro systemic sx   ROS: See pertinent positives and negatives per HPI.  Past Medical History:  Diagnosis Date  . Agatston coronary artery calcium score less than 100 2013  . Allergy   . Anxiety   . Environmental allergies   . GERD (gastroesophageal reflux disease)    pt hew symptoms, difficulty swallowing, lump in throat  . Gynecomastia 03/01/2012  . Headache(784.0)   . History of syncope    with low bp orthostatic onset hs  . Hives    chronic  . Hx of colonic polyps    5 year recall history of rectal bleeding  . Hx of skin cancer, basal cell   . Hyperlipidemia   . Internal hemorrhoids   . Migraine   . Urinary incontinence    related to childbirth urge and stress [name] helps    Past Surgical History:  Procedure Laterality Date  . CESAREAN SECTION    . DESTRUCTION OF LESION ANAL    . DILATION AND CURETTAGE OF UTERUS     for miscarrage  . REDUCTION MAMMAPLASTY  2013    Family History  Problem Relation Age of Onset  . Hyperlipidemia Mother   . Breast cancer Mother   .  Lung cancer Mother   . Depression Father   . Hypertension Father   . Other Father        tranverse mylitis  . Colon polyps Father   . Suicidality Father   . Depression Brother   . Hypertension Brother   . Depression Daughter   . Anxiety disorder Daughter   . Allergies Other        Children  . Colon polyps Paternal Grandmother   . Colon cancer Neg Hx        ? great uncles  . Rectal cancer Neg Hx   . Stomach cancer Neg Hx   . Esophageal cancer Neg Hx     Social History   Tobacco Use  . Smoking status: Never Smoker  . Smokeless tobacco: Never Used  Substance Use Topics  . Alcohol use: Yes    Alcohol/week: 14.0 standard drinks    Types: 14 Glasses of wine per week  . Drug use: No      Current Outpatient Medications:  .  ALPRAZolam (XANAX) 0.25 MG tablet, Take 1 tablet (0.25 mg total) by mouth 3 (three) times daily as needed for sleep. For panic attack, Disp: 20 tablet, Rfl: 0 .  amoxicillin-clavulanate (AUGMENTIN) 875-125 MG tablet, Take 1 tablet by mouth every 12 (  twelve) hours. For sinusitis, Disp: 14 tablet, Rfl: 0 .  escitalopram (LEXAPRO) 10 MG tablet, Take 1 tablet (10 mg total) by mouth daily., Disp: 90 tablet, Rfl: 2 .  fluticasone (FLONASE) 50 MCG/ACT nasal spray, Place 2 sprays into both nostrils daily as needed for allergies or rhinitis., Disp: , Rfl:  .  levocetirizine (XYZAL) 5 MG tablet, Take 5 mg by mouth every evening., Disp: , Rfl:  .  Multiple Vitamin (MULTI-VITAMIN PO), Take by mouth., Disp: , Rfl:  .  omeprazole (PRILOSEC) 40 MG capsule, Take 40 mg by mouth daily as needed., Disp: , Rfl:  .  oxybutynin (DITROPAN) 5 MG tablet, Take 5 mg by mouth daily as needed. , Disp: , Rfl: 4  EXAM: BP Readings from Last 3 Encounters:  02/21/19 110/70  02/12/19 104/60  09/25/18 100/62    VITALS per patient if applicable: non toxic  GENERAL: alert, oriented, appears well and in no acute distress HEENT: atraumatic, conjunttiva clear, no obvious abnormalities on  inspection of external nose and ears min congestion  Tender  Left cheek bone area  wehen touching  NECK: normal movements of the head and neck LUNGS: on inspection no signs of respiratory distress, breathing rate appears normal, no obvious gross SOB, gasping or wheezing CV: no obvious cyanosis PSYCH/NEURO: pleasant and cooperative, no obvious depression or anxiety, speech and thought processing grossly intact   ASSESSMENT AND PLAN:  Discussed the following assessment and plan:    ICD-10-CM   1. Sinusitis, unspecified chronicity, unspecified location  J32.9    most likely left maxill  with headache and pnd see text    Add antibiotic  And nasal steroids ics every day  Contact us if no relief after a week of other concerns  Or if worse Counseled.   Expectant management and discussion of plan and treatment with opportunity to ask questions and all were answered. The patient agreed with the plan and demonstrated an understanding of the instructions.   Advised to call back or seek an in-person evaluation if worsening  or having  further concerns .Marland Kitchen   Shanon Ace, MD

## 2019-06-30 ENCOUNTER — Encounter: Payer: Self-pay | Admitting: Gastroenterology

## 2019-07-04 ENCOUNTER — Telehealth: Payer: Self-pay | Admitting: Gastroenterology

## 2019-07-08 NOTE — Telephone Encounter (Signed)
Dr Bryan Lemma this patient asks to have you be her primary GI. Please advise.

## 2019-07-08 NOTE — Telephone Encounter (Signed)
Absolutely, happy to see her again.  Thank you.

## 2019-07-08 NOTE — Telephone Encounter (Signed)
ok 

## 2019-07-09 ENCOUNTER — Telehealth: Payer: Self-pay | Admitting: Gastroenterology

## 2019-07-09 NOTE — Telephone Encounter (Signed)
Does this patient need to be scheduled at [facility] for her procedure/ Please advise

## 2019-07-09 NOTE — Telephone Encounter (Signed)
She is ready for scheduling with Dr Bryan Lemma. Next available that works for her is fine.

## 2019-07-09 NOTE — Telephone Encounter (Signed)
Left message for patient to callback to schedule with Dr. Bryan Lemma

## 2019-07-10 NOTE — Telephone Encounter (Signed)
Heather, Can you please add this patient to the back log of procedures needed at [facility]; Thank you

## 2019-07-10 NOTE — Telephone Encounter (Signed)
Yes, I would schedule this to be done at [facility] given the potential need for submucosal lift and/or multiple clips given prior history.

## 2019-07-10 NOTE — Telephone Encounter (Signed)
Patient has been added to [employer].

## 2019-07-22 NOTE — Telephone Encounter (Signed)
Called and spoke with patient-patient informed of back log of patients who are needing to be scheduled at [facility] for their procedures; patient is aware to call back to the office in approximately 2 weeks to request if the 08/2019 schedule is available; patient's phone then disconnected; RN called the patient back and left a detailed message and also advised that the patient could call back to the office if questions/concerns arise;

## 2019-07-22 NOTE — Telephone Encounter (Signed)
Checking back because she has not heard back from anyone since she left the message. Is really concerned that this needs to be exactly 6 months from the last one.

## 2019-08-08 ENCOUNTER — Telehealth: Payer: Self-pay | Admitting: Gastroenterology

## 2019-08-08 DIAGNOSIS — K921 Melena: Secondary | ICD-10-CM

## 2019-08-08 DIAGNOSIS — R1013 Epigastric pain: Secondary | ICD-10-CM

## 2019-08-08 DIAGNOSIS — K2970 Gastritis, unspecified, without bleeding: Secondary | ICD-10-CM

## 2019-08-08 DIAGNOSIS — K625 Hemorrhage of anus and rectum: Secondary | ICD-10-CM

## 2019-08-08 DIAGNOSIS — R197 Diarrhea, unspecified: Secondary | ICD-10-CM

## 2019-08-08 NOTE — Telephone Encounter (Signed)
Called and spoke with patient-patient reports she needs to be scheduled at [facility] for a colonoscopy; patient advised no dates/times are available at this time and that the office will notify her when one becomes available; patient verbalized understanding of information and will call back to the office should questions/concerns arise;

## 2019-08-08 NOTE — Telephone Encounter (Signed)
Pt would like to schedule colon at the hospital. Pls call her.

## 2019-08-13 ENCOUNTER — Other Ambulatory Visit: Payer: Self-pay | Admitting: Internal Medicine

## 2019-08-21 ENCOUNTER — Other Ambulatory Visit: Payer: Self-pay

## 2019-08-21 DIAGNOSIS — K2970 Gastritis, unspecified, without bleeding: Secondary | ICD-10-CM

## 2019-08-21 DIAGNOSIS — K921 Melena: Secondary | ICD-10-CM

## 2019-08-21 DIAGNOSIS — R197 Diarrhea, unspecified: Secondary | ICD-10-CM

## 2019-08-21 DIAGNOSIS — K625 Hemorrhage of anus and rectum: Secondary | ICD-10-CM

## 2019-08-21 DIAGNOSIS — R1013 Epigastric pain: Secondary | ICD-10-CM

## 2019-08-21 MED ORDER — NA SULFATE-K SULFATE-MG SULF 17.5-3.13-1.6 GM/177ML PO SOLN: 1.0000 | Freq: Once | ORAL | 0 refills | Status: AC

## 2019-08-21 NOTE — Telephone Encounter (Signed)
Called and spoke with patient-patient is agreeable to plan of having procedure at [facility] on 10/07/2019 at 8:30 am; prep instructions sent to patient including COVID screening date of 10/03/2019 at 10:10am; patient advised to call back to the office should questions/concerns arise; patient verbalized understanding of information/instructions;

## 2019-10-02 ENCOUNTER — Encounter: Payer: Self-pay | Admitting: Gastroenterology

## 2019-10-03 ENCOUNTER — Other Ambulatory Visit (HOSPITAL_COMMUNITY)
Admission: RE | Admit: 2019-10-03 | Discharge: 2019-10-03 | Disposition: A | Discharge status: Home / Self Care | Payer: 59 | Source: Ambulatory Visit | Attending: Gastroenterology | Admitting: Gastroenterology

## 2019-10-03 DIAGNOSIS — Z20828 Contact with and (suspected) exposure to other viral communicable diseases: Secondary | ICD-10-CM | POA: Insufficient documentation

## 2019-10-03 DIAGNOSIS — Z01812 Encounter for preprocedural laboratory examination: Secondary | ICD-10-CM | POA: Insufficient documentation

## 2019-10-04 LAB — NOVEL CORONAVIRUS, NAA (HOSP ORDER, SEND-OUT TO REF LAB; TAT 18-24 HRS): SARS-CoV-2, NAA: NOT DETECTED

## 2019-10-06 ENCOUNTER — Encounter (HOSPITAL_COMMUNITY): Payer: Self-pay | Admitting: *Deleted

## 2019-10-06 ENCOUNTER — Other Ambulatory Visit: Payer: Self-pay

## 2019-10-06 NOTE — Anesthesia Preprocedure Evaluation (Addendum)
Anesthesia Evaluation  Patient identified by MRN, date of birth, ID band Patient awake    Reviewed: Allergy & Precautions, NPO status , Patient's Chart, lab work & pertinent test results  History of Anesthesia Complications Negative for: history of anesthetic complications  Airway Mallampati: II  TM Distance: >3 FB Neck ROM: Full    Dental  (+) Dental Advisory Given   Pulmonary neg pulmonary ROS,    Pulmonary exam normal        Cardiovascular Normal cardiovascular exam   Orthostatic hypotension with syncopal episodes    Neuro/Psych  Headaches, PSYCHIATRIC DISORDERS Anxiety    GI/Hepatic Neg liver ROS, GERD  Controlled,  Endo/Other  negative endocrine ROS  Renal/GU negative Renal ROS    Urinary incontinence     Musculoskeletal negative musculoskeletal ROS (+)   Abdominal   Peds  Hematology negative hematology ROS (+)   Anesthesia Other Findings   Reproductive/Obstetrics                            Anesthesia Physical Anesthesia Plan  ASA: II  Anesthesia Plan: MAC   Post-op Pain Management:    Induction: Intravenous  PONV Risk Score and Plan: 2 and Propofol infusion and Treatment may vary due to age or medical condition  Airway Management Planned: Nasal Cannula and Natural Airway  Additional Equipment: None  Intra-op Plan:   Post-operative Plan:   Informed Consent: I have reviewed the patients History and Physical, chart, labs and discussed the procedure including the risks, benefits and alternatives for the proposed anesthesia with the patient or authorized representative who has indicated his/her understanding and acceptance.       Plan Discussed with: CRNA and Anesthesiologist  Anesthesia Plan Comments:        Anesthesia Quick Evaluation

## 2019-10-07 ENCOUNTER — Ambulatory Visit (HOSPITAL_COMMUNITY): Payer: 59 | Admitting: Anesthesiology

## 2019-10-07 ENCOUNTER — Encounter (HOSPITAL_COMMUNITY): Payer: Self-pay

## 2019-10-07 ENCOUNTER — Ambulatory Visit (HOSPITAL_COMMUNITY)
Admission: RE | Admit: 2019-10-07 | Discharge: 2019-10-07 | Disposition: A | Discharge status: Home / Self Care | Payer: 59 | Attending: Gastroenterology | Admitting: Gastroenterology

## 2019-10-07 ENCOUNTER — Encounter (HOSPITAL_COMMUNITY)
Admission: RE | Disposition: A | Discharge status: Home / Self Care | Payer: Self-pay | Source: Home / Self Care | Attending: Gastroenterology

## 2019-10-07 ENCOUNTER — Telehealth: Payer: Self-pay

## 2019-10-07 DIAGNOSIS — D127 Benign neoplasm of rectosigmoid junction: Secondary | ICD-10-CM

## 2019-10-07 DIAGNOSIS — K921 Melena: Secondary | ICD-10-CM

## 2019-10-07 DIAGNOSIS — F419 Anxiety disorder, unspecified: Secondary | ICD-10-CM | POA: Diagnosis not present

## 2019-10-07 DIAGNOSIS — Z860100 Personal history of colon polyps, unspecified: Secondary | ICD-10-CM

## 2019-10-07 DIAGNOSIS — Z85038 Personal history of other malignant neoplasm of large intestine: Secondary | ICD-10-CM | POA: Diagnosis not present

## 2019-10-07 DIAGNOSIS — K644 Residual hemorrhoidal skin tags: Secondary | ICD-10-CM | POA: Diagnosis not present

## 2019-10-07 DIAGNOSIS — K635 Polyp of colon: Secondary | ICD-10-CM | POA: Diagnosis not present

## 2019-10-07 DIAGNOSIS — D128 Benign neoplasm of rectum: Secondary | ICD-10-CM

## 2019-10-07 DIAGNOSIS — R197 Diarrhea, unspecified: Secondary | ICD-10-CM

## 2019-10-07 DIAGNOSIS — J302 Other seasonal allergic rhinitis: Secondary | ICD-10-CM | POA: Diagnosis not present

## 2019-10-07 DIAGNOSIS — R1013 Epigastric pain: Secondary | ICD-10-CM

## 2019-10-07 DIAGNOSIS — K625 Hemorrhage of anus and rectum: Secondary | ICD-10-CM

## 2019-10-07 DIAGNOSIS — K2970 Gastritis, unspecified, without bleeding: Secondary | ICD-10-CM

## 2019-10-07 DIAGNOSIS — Z1211 Encounter for screening for malignant neoplasm of colon: Secondary | ICD-10-CM | POA: Insufficient documentation

## 2019-10-07 DIAGNOSIS — D122 Benign neoplasm of ascending colon: Secondary | ICD-10-CM

## 2019-10-07 HISTORY — PX: COLONOSCOPY WITH PROPOFOL: SHX5780

## 2019-10-07 HISTORY — PX: BIOPSY: SHX5522

## 2019-10-07 SURGERY — COLONOSCOPY WITH PROPOFOL
Anesthesia: Monitor Anesthesia Care

## 2019-10-07 MED ORDER — LIDOCAINE 2% (20 MG/ML) 5 ML SYRINGE
INTRAMUSCULAR | Status: DC | PRN
  Administered 2019-10-07: 80 mg via INTRAVENOUS

## 2019-10-07 MED ORDER — PROPOFOL 10 MG/ML IV BOLUS
INTRAVENOUS | Status: DC | PRN
  Administered 2019-10-07: 30 mg via INTRAVENOUS
  Administered 2019-10-07 (×2): 20 mg via INTRAVENOUS

## 2019-10-07 MED ORDER — LACTATED RINGERS IV SOLN
INTRAVENOUS | Status: DC
  Administered 2019-10-07: 08:00:00 via INTRAVENOUS

## 2019-10-07 MED ORDER — PROPOFOL 500 MG/50ML IV EMUL
INTRAVENOUS | Status: AC
  Filled 2019-10-07: qty 150

## 2019-10-07 MED ORDER — PROPOFOL 500 MG/50ML IV EMUL
INTRAVENOUS | Status: DC | PRN
  Administered 2019-10-07: 100 ug/kg/min via INTRAVENOUS

## 2019-10-07 MED ORDER — SODIUM CHLORIDE 0.9 % IV SOLN: INTRAVENOUS | Status: DC

## 2019-10-07 SURGICAL SUPPLY — 21 items

## 2019-10-07 NOTE — Telephone Encounter (Signed)
Yes please. Rectosigmoid Adenocarcinoma s/p endoscopic resection. Thanks.

## 2019-10-07 NOTE — Discharge Instructions (Signed)

## 2019-10-07 NOTE — Transfer of Care (Signed)
Immediate Anesthesia Transfer of Care Note  Patient: Tamara Watkins  Procedure(s) Performed: COLONOSCOPY WITH PROPOFOL (N/A ) BIOPSY  Patient Location: Endoscopy Unit  Anesthesia Type:MAC  Level of Consciousness: awake, alert , oriented and patient cooperative  Airway & Oxygen Therapy: Patient Spontanous Breathing and Patient connected to face mask oxygen  Post-op Assessment: Report given to RN, Post -op Vital signs reviewed and stable and Patient moving all extremities  Post vital signs: Reviewed and stable  Last Vitals:  Vitals Value Taken Time  BP    Temp    Pulse 69 10/07/19 0858  Resp 14 10/07/19 0858  SpO2 100 % 10/07/19 0858  Vitals shown include unvalidated device data.  Last Pain:  Vitals:   10/07/19 0724  TempSrc: Oral  PainSc: 0-No pain         Complications: No apparent anesthesia complications

## 2019-10-07 NOTE — Telephone Encounter (Signed)
Referral has been faxed to [facility] for appt with Dr. Dema Severin; awaiting response;

## 2019-10-07 NOTE — Telephone Encounter (Addendum)
Does a referral need to be sent to Dr. Dema Severin at [facility]? Diagnosis/reason?

## 2019-10-07 NOTE — H&P (Signed)
P  Chief Complaint:    Polyp surveillance  GI History: 53 year old female with colonoscopy in 01/2019 performed for diarrhea.  Biopsies were normal/negative for Microscopic Colitis.  2 small right-sided tubular adenomas resected.  A larger 25 mm polyp in the rectosigmoid colon resected.  Pathology demonstrated tubulovillous adenoma with high-grade dysplasia and a small 1 mm focus of adenocarcinoma.  While the adenocarcinoma invaded the superficial submucosa, this was located 3 mm from the cauterized margin, demonstrating clear margins.  She presents today for repeat colonoscopy to ensure complete resection and rule out any new polyps.  Plan for biopsies at the scar site.  HPI:     Patient is a 53 y.o. female presenting [facility] for repeat colonoscopy.  Colonoscopy performed in 01/2019 as outlined above, notable for a 25 mm polyp in the rectosigmoid colon, with pathology demonstrating TVA with HGD and a small 1 mm focus of adenocarcinoma.  This polyp was resected entirely, with clear margins at the cauterized edge.  She is otherwise without active GI symptoms.  Review of systems:     No chest pain, no SOB, no fevers, no urinary sx   Past Medical History:  Diagnosis Date  . Agatston coronary artery calcium score less than 100 2013  . Allergy   . Anxiety   . Environmental allergies   . GERD (gastroesophageal reflux disease)    pt hew symptoms, difficulty swallowing, lump in throat  . Gynecomastia 03/01/2012  . Headache(784.0)   . History of syncope    with low bp orthostatic onset hs  . Hives    chronic  . Hx of colonic polyps    5 year recall history of rectal bleeding  . Hx of skin cancer, basal cell   . Hyperlipidemia   . Internal hemorrhoids   . Migraine   . Urinary incontinence    related to childbirth urge and stress [name] helps    Patient's surgical history, family medical history, social history, medications and allergies were all reviewed in Epic    Current Facility-Administered Medications  Medication Dose Route Frequency Provider Last Rate Last Dose  . lactated ringers infusion   Intravenous Continuous Neilan Rizzo V, DO 20 mL/hr at 10/07/19 [id number]      Physical Exam:     BP 123/81   Pulse 83   Temp 97.9 F (36.6 C) (Oral)   Resp 14   LMP 09/30/2019   SpO2 97%   GENERAL:  Pleasant female in NAD PSYCH: : Cooperative, normal affect EENT:  conjunctiva pink, mucous membranes moist, neck supple without masses CARDIAC:  RRR, no murmur heard, no peripheral edema PULM: Normal respiratory effort, lungs CTA bilaterally, no wheezing ABDOMEN:  Nondistended, soft, nontender. No obvious masses, no hepatomegaly,  normal bowel sounds SKIN:  turgor, no lesions seen Musculoskeletal:  Normal muscle tone, normal strength NEURO: Alert and oriented x 3, no focal neurologic deficits   IMPRESSION and PLAN:     1) Polyp surveillance 2) Focal Colonic Adenocarcinoma s/p endoscopic resection  -Plan for repeat colonoscopy today with inspection of prior polypectomy site, biopsies of the site/scar and resection of any new or remnant polyp.  The indications, risks, and benefits of colonoscopy were explained to the patient in detail. Risks include but are not limited to bleeding, perforation, adverse reaction to medications, and cardiopulmonary compromise. Sequelae include but are not limited to the possibility of surgery, hospitalization, and mortality. The patient verbalized understanding and wished to proceed.  [place] ,DO, FACG 10/07/2019, 8:11 AM

## 2019-10-07 NOTE — Anesthesia Postprocedure Evaluation (Signed)
Anesthesia Post Note  Patient: Tamara Watkins  Procedure(s) Performed: COLONOSCOPY WITH PROPOFOL (N/A ) BIOPSY     Patient location during evaluation: PACU Anesthesia Type: MAC Level of consciousness: awake and alert Pain management: pain level controlled Vital Signs Assessment: post-procedure vital signs reviewed and stable Respiratory status: spontaneous breathing, nonlabored ventilation and respiratory function stable Cardiovascular status: stable and blood pressure returned to baseline Anesthetic complications: no    Last Vitals:  Vitals:   10/07/19 0910 10/07/19 0920  BP: (!) 98/59 103/68  Pulse: 76 70  Resp: 20 13  Temp:    SpO2: 97% 100%    Last Pain:  Vitals:   10/07/19 0910  TempSrc:   PainSc: 0-No pain                 Audry Pili

## 2019-10-07 NOTE — Interval H&P Note (Signed)
History and Physical Interval Note:  10/07/2019 8:16 AM  Tamara Watkins  has presented today for surgery, with the diagnosis of hematochezia/diarrhea.  The various methods of treatment have been discussed with the patient and family. After consideration of risks, benefits and other options for treatment, the patient has consented to  Procedure(s): COLONOSCOPY WITH PROPOFOL (N/A) as a surgical intervention.  The patient's history has been reviewed, patient examined, no change in status, stable for surgery.  I have reviewed the patient's chart and labs.  Questions were answered to the patient's satisfaction.     Dominic Pea Colter Magowan

## 2019-10-07 NOTE — Op Note (Addendum)
[facility] Patient Name: Tamara Watkins Procedure Date: 10/07/2019 MRN: [id number] Attending MD: Gerrit Heck , MD Date of Birth: 12-11-1965 CSN: [id number] Age: 53 Admit Type: Outpatient Procedure:                Colonoscopy Indications:              Surveillance: High risk for colon cancer and                            History of adenomatous polyps, last colonoscopy (<1                            yr)                           53 yo female with colonoscopy in 01/2019 notable for                            25 mm TVA with HGD and a 1 mm focus of                            Adenocarcinoma, resected entirely with clear margin                            at the cauterized edge. She presents today to                            ensure complete resection and no recurrence. She is                            otherwise without active GI symptoms. Providers:                Gerrit Heck, MD, Grace Isaac, RN, [name], Technician, Caryl Pina CRNA Referring MD:              Medicines:                Monitored Anesthesia Care Complications:            No immediate complications. Estimated Blood Loss:     Estimated blood loss was minimal. Procedure:                Pre-Anesthesia Assessment:                           - Prior to the procedure, a History and Physical                            was performed, and patient medications and                            allergies were reviewed. The patient's tolerance of  previous anesthesia was also reviewed. The risks                            and benefits of the procedure and the sedation                            options and risks were discussed with the patient.                            All questions were answered, and informed consent                            was obtained. Prior Anticoagulants: The patient has                            taken no previous anticoagulant  or antiplatelet                            agents. ASA Grade Assessment: II - A patient with                            mild systemic disease. After reviewing the risks                            and benefits, the patient was deemed in                            satisfactory condition to undergo the procedure.                           After obtaining informed consent, the colonoscope                            was passed under direct vision. Throughout the                            procedure, the patient's blood pressure, pulse, and                            oxygen saturations were monitored continuously. The                            [id number] Olympus colonoscope was                            introduced through the anus and advanced to the the                            cecum, identified by appendiceal orifice and                            ileocecal valve. The colonoscopy was performed  without difficulty. The patient tolerated the                            procedure well. The quality of the bowel                            preparation was excellent. The ileocecal valve,                            appendiceal orifice, and rectum were photographed. Scope In: 8:32:32 AM Scope Out: 8:51:31 AM Scope Withdrawal Time: 0 hours 14 minutes 50 seconds  Total Procedure Duration: 0 hours 18 minutes 59 seconds  Findings:      Skin tags were found on perianal exam.      A 2 mm polyp was found in the recto-sigmoid colon. The polyp was       sessile. This was located within the tattoo field. The polyp was removed       with a cold biopsy forceps. Resection and retrieval were complete.       Estimated blood loss was minimal.      A tattoo was seen in the recto-sigmoid colon.      A post polypectomy scar was found in the recto-sigmoid colon, located 16       cm from the anal verge. The scar tissue was healthy in appearance. There       was no evidence of the  previous polyp. Several mucosal biopsies were       taken with a cold forceps for histology. Estimated blood loss was       minimal.      The exam was otherwise normal throughout the remainder of the colon.      Retroflexion in the right colon was performed.      The retroflexed view of the distal rectum and anal verge was normal and       showed no anal or rectal abnormalities. Impression:               - Perianal skin tags found on perianal exam.                           - One 2 mm polyp at the recto-sigmoid colon,                            removed with a cold biopsy forceps. Resected and                            retrieved.                           - A tattoo was seen in the recto-sigmoid colon.                           - Post-polypectomy scar in the recto-sigmoid colon.                            Biopsied.                           -  The distal rectum and anal verge are normal on                            retroflexion view. Moderate Sedation:      Not Applicable - Patient had care per Anesthesia. Recommendation:           - Patient has a contact number available for                            emergencies. The signs and symptoms of potential                            delayed complications were discussed with the                            patient. Return to normal activities tomorrow.                            Written discharge instructions were provided to the                            patient.                           - Resume previous diet.                           - Continue present medications.                           - Await pathology results.                           - Repeat colonoscopy in 1 year for surveillance. If                            still no recurrence, can plan to liberalize                            surveillance interval at that time.                           - Return to GI office in 2 months. Procedure Code(s):        --- Professional ---                            [phone], Colonoscopy, flexible; with biopsy, single                            or multiple Diagnosis Code(s):        --- Professional ---                           Z86.010, Personal history of colonic polyps  K63.5, Polyp of colon                           Z98.890, Other specified postprocedural states                           K64.4, Residual hemorrhoidal skin tags CPT copyright 2019 American Medical Association. All rights reserved. The codes documented in this report are preliminary and upon coder review may  be revised to meet current compliance requirements. Gerrit Heck, MD 10/07/2019 9:06:03 AM Number of Addenda: 0

## 2019-10-09 ENCOUNTER — Encounter (HOSPITAL_COMMUNITY): Payer: Self-pay | Admitting: Gastroenterology

## 2019-10-09 LAB — SURGICAL PATHOLOGY

## 2019-10-09 NOTE — Telephone Encounter (Signed)
Message sent to [facility] for appt date/time for this patient-awaiting response

## 2019-10-09 NOTE — Telephone Encounter (Signed)
I discussed the referral and pathology results with Ms. Tamara Watkins in detail today.  Please see separate note listed under pathology results.  All questions answered and she is very appreciative for the phone call.

## 2019-10-10 NOTE — Telephone Encounter (Signed)
Called and spoke with patient-patient informed of MD recommendations; patient is agreeable with plan of care and patient has been scheduled for CT scan on 10/20/2019 at 3:30 pm and MRI on 10/20/2019 at 5:00 pm; patient is to be NPO after 11:00am; patient has been sent instructions for CT scan/MRI scan- contrast and printed instructions have been placed at the front desk for patient to pick up; patient is aware of need to pick up contrast/instructions; Patient verbalized understanding of information/instructions;  Patient was advised to call the office at [phone] if questions/concerns arise;

## 2019-10-20 ENCOUNTER — Ambulatory Visit (HOSPITAL_COMMUNITY)
Admission: RE | Admit: 2019-10-20 | Discharge: 2019-10-20 | Disposition: A | Discharge status: Home / Self Care | Payer: 59 | Source: Ambulatory Visit | Attending: Gastroenterology | Admitting: Gastroenterology

## 2019-10-20 ENCOUNTER — Other Ambulatory Visit: Payer: Self-pay | Admitting: Gastroenterology

## 2019-10-20 ENCOUNTER — Other Ambulatory Visit: Payer: Self-pay

## 2019-10-20 DIAGNOSIS — R1013 Epigastric pain: Secondary | ICD-10-CM

## 2019-10-20 DIAGNOSIS — K921 Melena: Secondary | ICD-10-CM | POA: Diagnosis present

## 2019-10-20 DIAGNOSIS — D122 Benign neoplasm of ascending colon: Secondary | ICD-10-CM

## 2019-10-20 DIAGNOSIS — D127 Benign neoplasm of rectosigmoid junction: Secondary | ICD-10-CM

## 2019-10-20 DIAGNOSIS — K2990 Gastroduodenitis, unspecified, without bleeding: Secondary | ICD-10-CM

## 2019-10-20 DIAGNOSIS — R197 Diarrhea, unspecified: Secondary | ICD-10-CM

## 2019-10-20 DIAGNOSIS — K625 Hemorrhage of anus and rectum: Secondary | ICD-10-CM

## 2019-10-20 DIAGNOSIS — K2970 Gastritis, unspecified, without bleeding: Secondary | ICD-10-CM | POA: Diagnosis present

## 2019-10-20 MED ORDER — IOHEXOL 300 MG/ML  SOLN
100.0000 mL | Freq: Once | INTRAMUSCULAR | Status: AC | PRN
  Administered 2019-10-20: 100 mL via INTRAVENOUS

## 2019-10-20 MED ORDER — SODIUM CHLORIDE (PF) 0.9 % IJ SOLN
INTRAMUSCULAR | Status: AC
  Filled 2019-10-20: qty 50

## 2019-10-28 ENCOUNTER — Telehealth: Payer: Self-pay | Admitting: Gastroenterology

## 2019-10-28 NOTE — Telephone Encounter (Signed)
Patient was seen by Dr. Dema Severin at [facility] on 10/28/2019.  Notes reviewed with the following recommendations.  -Plan for ongoing close surveillance rather than surgical intervention -Repeat flexible sigmoidoscopy in 3 to 6 months for surveillance -Repeat pelvic MRI in 6 months -Will follow-up in the Colorectal Surgery clinic in about 3 months -Record to be scanned into media  Can you please schedule for repeat flexible sigmoidoscopy with me in 3 to 6 months, and MRI pelvis in 6 months.  Flexible sigmoidoscopy can be done at [facility].  Thank you.

## 2019-10-31 ENCOUNTER — Other Ambulatory Visit: Payer: Self-pay

## 2019-10-31 ENCOUNTER — Ambulatory Visit: Payer: 59 | Attending: Surgery | Admitting: Physical Therapy

## 2019-10-31 ENCOUNTER — Encounter: Payer: Self-pay | Admitting: Physical Therapy

## 2019-10-31 DIAGNOSIS — M6281 Muscle weakness (generalized): Secondary | ICD-10-CM | POA: Diagnosis not present

## 2019-10-31 DIAGNOSIS — N3941 Urge incontinence: Secondary | ICD-10-CM | POA: Diagnosis present

## 2019-10-31 DIAGNOSIS — R279 Unspecified lack of coordination: Secondary | ICD-10-CM | POA: Diagnosis present

## 2019-10-31 NOTE — Telephone Encounter (Signed)
Spoke to patient to inform her of dr Cirigliano's recommendations. She would like to have the flex Sigmoidoscopy in [place] she will be scheduled for the CT in June. She does not have a follow up appointment with [facility],but would like to wait until after her procedure's. She will discus that request with [facility]. All questions answered, patient voiced understanding. Staff message sent to self to schedule procedure's close to the time.

## 2019-10-31 NOTE — Patient Instructions (Signed)
iStim Kegel Exerciser Incontinence Stimulator with Probe - iStim V2 - for Bladder Control and Pelvic Floor Exercise - for Women and Men - Water engineer (EMS)  Massage the perineal body 5 minutes daily [facility] [address] [place], [place] [address] Phone # [phone] Fax [phone]

## 2019-10-31 NOTE — Therapy (Signed)
[facility] [address] [place], [place], [address] Phone: [phone]   Fax:  [phone]  Physical Therapy Evaluation  Patient Details  Name: Tamara Watkins MRN: [id number] Date of Birth: 05-04-1966 Referring Provider (PT): Dr. Nadeen Landau   Encounter Date: 10/31/2019  PT End of Session - 10/31/19 0922    Visit Number  1    Date for PT Re-Evaluation  01/23/20    Authorization Type  [employer]    PT Start Time  0830    PT Stop Time  0910    PT Time Calculation (min)  40 min    Activity Tolerance  Patient tolerated treatment well    Behavior During Therapy  [facility] for tasks assessed/performed       Past Medical History:  Diagnosis Date  . Agatston coronary artery calcium score less than 100 2013  . Allergy   . Anxiety   . Environmental allergies   . GERD (gastroesophageal reflux disease)    pt hew symptoms, difficulty swallowing, lump in throat  . Gynecomastia 03/01/2012  . Headache(784.0)   . History of syncope    with low bp orthostatic onset hs  . Hives    chronic  . Hx of colonic polyps    5 year recall history of rectal bleeding  . Hx of skin cancer, basal cell   . Hyperlipidemia   . Internal hemorrhoids   . Migraine   . Urinary incontinence    related to childbirth urge and stress [name] helps    Past Surgical History:  Procedure Laterality Date  . BIOPSY  10/07/2019   Procedure: BIOPSY;  Surgeon: Lavena Bullion, DO;  Location: [facility];  Service: Gastroenterology;;  . CESAREAN SECTION    . COLONOSCOPY WITH PROPOFOL N/A 10/07/2019   Procedure: COLONOSCOPY WITH PROPOFOL;  Surgeon: Lavena Bullion, DO;  Location: [facility];  Service: Gastroenterology;  Laterality: N/A;  . DESTRUCTION OF LESION ANAL    . DILATION AND CURETTAGE OF UTERUS     for miscarrage  . REDUCTION MAMMAPLASTY  2013    There were no vitals filed for this visit.   Subjective Assessment - 10/31/19 0831    Subjective  Patient had a 4th degree tear with first daughter. Has weakness in pelvic floor. She has urge incontinence. Leak with diarrhea. BM couple times per day. When stand up from the commode, more urine will come out.    Patient Stated Goals  strengthen pelvic floor to reduce urge incontinence    Currently in Pain?  No/denies    Multiple Pain Sites  No         [facility] PT Assessment - 10/31/19 0001      Assessment   Medical Diagnosis  C20 Rectal Cancer    Referring Provider (PT)  Dr. Nadeen Landau    Onset Date/Surgical Date  --   11/07/02     Precautions   Precautions  Other (comment)    Precaution Comments  cancer      Restrictions   Weight Bearing Restrictions  No      Balance Screen   Has the patient fallen in the past 6 months  No      [place] residence      Prior Function   Level of Independence  Independent    Vocation  Full time employment    Vocation Requirements  sit at a desk    Leisure  will start  exercising      Cognition   Overall Cognitive Status  Within Functional Limits for tasks assessed      Posture/Postural Control   Posture/Postural Control  No significant limitations      ROM / Strength   AROM / PROM / Strength  AROM;PROM;Strength      AROM   Overall AROM Comments  lumbar ROM is full      Strength   Right Hip Extension  4/5    Right Hip ABduction  4/5      Palpation   SI assessment   right ilium is anteriorly rotated    Palpation comment  restrictions in c-section scar, tightness in the diaphragm                Objective measurements completed on examination: See above findings.    Pelvic Floor Special Questions - 10/31/19 0001    Prior Pregnancies  Yes    Number of Pregnancies  2    Number of C-Sections  1    Number of Vaginal Deliveries  1    Diastasis Recti  none    Currently Sexually Active  Yes    Is this Painful  No    Urinary Leakage  Yes    Activities that cause  leaking  With strong urge    Skin Integrity  Intact   dry   Perineal Body/Introitus   Gaping    External Palpation  tightness    Prolapse  Anterior Wall   comes 1/2 inch prior to the introitus, similiar to grade 2   Pelvic Floor Internal Exam  Patient confirms identification and approves PT to assess pelvic floor and treatemtn    Exam Type  Vaginal    Palpation  only feel the perineal body move and rest of introitus does not move    Strength  --   0/10      [facility] Adult PT Treatment/Exercise - 10/31/19 0001      Self-Care   Self-Care  Other Self-Care Comments    Other Self-Care Comments   education on purchasing a vaginal electrical stimulation unit      Manual Therapy   Manual Therapy  Internal Pelvic Floor    Internal Pelvic Floor  scar massage to the perineal body while teaching patient how to massage the perineal body to improve tissue mobility             PT Education - 10/31/19 0924    Education Details  education on purchasing an electrical stimulation unit for the vaginal muscles, perineal body massage    Person(s) Educated  Patient    Methods  Explanation;Demonstration;Handout    Comprehension  Verbalized understanding;Returned demonstration       PT Short Term Goals - 10/31/19 0934      PT SHORT TERM GOAL #1   Title  independent with initial HEP    Time  4    Period  Weeks    Status  New    Target Date  11/28/19      PT SHORT TERM GOAL #2   Title  understand how to perform perineal massage to improve tissue mobility    Time  4    Period  Weeks    Status  New    Target Date  11/28/19      PT SHORT TERM GOAL #3   Title  understand how to use the vaginal electrical stimulation unit to work on pelvic floor contraction  Time  4    Period  Weeks    Status  New    Target Date  11/28/19      PT SHORT TERM GOAL #4   Title  pelvic floor strength >/= 1/5 due to elongation of pelvic floor tissue    Time  4    Period  Weeks    Status  New    Target  Date  11/28/19        PT Long Term Goals - 10/31/19 0937      PT LONG TERM GOAL #1   Title  independent with advanced exercises    Time  12    Period  Weeks    Status  New    Target Date  01/23/20      PT LONG TERM GOAL #2   Title  pelvic floor strength >/= 3/5 holding for 15 seconds due to elongation of tissue and ability to contract    Time  12    Period  Weeks    Status  New    Target Date  01/23/20      PT LONG TERM GOAL #3   Title  urge to void reduced >/= 80% due to ability to contract her pelvic floor muscles and able to walk to the bathroom slowly    Time  12    Period  Weeks    Status  New    Target Date  01/23/20      PT LONG TERM GOAL #4   Title  understand ways to manage prolapse to reduce further progression    Time  12    Period  Weeks    Status  New    Target Date  01/23/20      PT LONG TERM GOAL #5   Title  able to contract the pelvic floor without bearing down due to improve coordination of the pelvic floor muscles    Time  12    Period  Weeks    Status  New    Target Date  01/23/20             Plan - 10/31/19 [id number]    Clinical Impression Statement  Patient is a 53 year old female with diagnoisis of Rectal cancer. She is being monitored at this time. Patient reports she had a 4th degree tear in 2003 when she had her first child and since then has had weakness in the pelvic floor. Patient will have to double void and will have urinary leakage with urge to void. Patient pelvic floor will only move at the perineal body. She will bear down when trying to perform a pelvic floor contraction. Patient does not contract around the levator ani insertion on the pubic rami. When patient bears down, her bladder descends to 0.5 inch from the introitus resembling a grade 2 prolapse. Patient has thickness of the scar on the perineal body. Abdominal strength is 2/5 and will bulge her lower abdomen instead of contracting. Patient has tightness in the diaphragm.  Patient right hip is 4/5 strength for abduction and extension. Right ilium is anteriorly rotated. Patient will benefit from skilled therapy to improve pelvic floor strength and coordination and core strength.    Personal Factors and Comorbidities  Comorbidity 1;Comorbidity 2;Time since onset of injury/illness/exacerbation    Comorbidities  grade 4 tear with first child, rectal cancer    Examination-Activity Limitations  Continence;Toileting    Examination-Participation Restrictions  Interpersonal Relationship    Stability/Clinical Decision  Making  Evolving/Moderate complexity    Clinical Decision Making  Low    Rehab Potential  Excellent    PT Frequency  1x / week    PT Duration  12 weeks    PT Treatment/Interventions  Biofeedback;Therapeutic activities;Therapeutic exercise;Patient/family education;Neuromuscular re-education;Manual techniques;Scar mobilization;Dry needling    PT Next Visit Plan  see if she has gotten the electrical stimulation unit; pelvic floor soft tissue work, resistive hip flexion to engage abdominals, correct pelvis    Consulted and Agree with Plan of Care  Patient       Patient will benefit from skilled therapeutic intervention in order to improve the following deficits and impairments:  Decreased coordination, Increased fascial restricitons, Decreased endurance, Decreased scar mobility, Decreased strength  Visit Diagnosis: Muscle weakness (generalized) - Plan: PT plan of care cert/re-cert  Unspecified lack of coordination - Plan: PT plan of care cert/re-cert  Urge incontinence - Plan: PT plan of care cert/re-cert     Problem List Patient Active Problem List   Diagnosis Date Noted  . Tubulovillous adenoma polyp of rectum   . Polyp of sigmoid colon   . Galactorrhea not associated with childbirth 09/25/2018  . Menorrhagia 09/25/2018  . Vulvitis 09/25/2018  . Breakthrough bleeding mid cycle  01/27/2015  . Irregular menstrual cycle 06/08/2014  . Weight gain  06/08/2014  . Hoarseness 06/08/2014  . Sore throat 06/08/2014  . Sleep disturbance 06/08/2014  . Abnormal skin color 10/30/2012  . Agatston coronary artery calcium score less than 100   . Neck pain, chronic 03/01/2012  . Chronic upper back pain 03/01/2012  . Allergic rhinitis, seasonal 03/01/2012  . Cervicogenic migraine 03/01/2012  . Encounter for preventive health examination 05/22/2011  . Goiter 04/28/2011  . POSTNASAL DRIP SYNDROME 08/05/2010  . CERVICAL LYMPHADENOPATHY, RIGHT 08/05/2010  . FATIGUE 04/09/2009  . Hyperlipidemia 03/09/2009  . PANIC DISORDER 03/09/2009  . URINARY INCONTINENCE 03/09/2009  . SKIN CANCER, HX OF 03/09/2009  . History of colonic polyps 03/09/2009    Earlie Counts, PT 10/31/19 9:45 AM    [facility] [address] [place], [place], [address] Phone: [phone]   Fax:  [phone]  Name: Tamara Watkins MRN: [id number] Date of Birth: Jan 01, 1966

## 2019-11-14 ENCOUNTER — Encounter: Payer: 59 | Admitting: Physical Therapy

## 2019-11-26 ENCOUNTER — Ambulatory Visit (INDEPENDENT_AMBULATORY_CARE_PROVIDER_SITE_OTHER): Payer: 59 | Admitting: Gastroenterology

## 2019-11-26 ENCOUNTER — Encounter: Payer: Self-pay | Admitting: Gastroenterology

## 2019-11-26 ENCOUNTER — Ambulatory Visit: Payer: 59 | Admitting: Physical Therapy

## 2019-11-26 ENCOUNTER — Other Ambulatory Visit: Payer: Self-pay

## 2019-11-26 ENCOUNTER — Encounter: Payer: Self-pay | Admitting: Physical Therapy

## 2019-11-26 VITALS — BP 122/86 | HR 80 | Temp 97.9°F | Ht 66.5 in | Wt 172.4 lb

## 2019-11-26 DIAGNOSIS — R279 Unspecified lack of coordination: Secondary | ICD-10-CM

## 2019-11-26 DIAGNOSIS — N3941 Urge incontinence: Secondary | ICD-10-CM

## 2019-11-26 DIAGNOSIS — Z86010 Personal history of colonic polyps: Secondary | ICD-10-CM

## 2019-11-26 DIAGNOSIS — C187 Malignant neoplasm of sigmoid colon: Secondary | ICD-10-CM | POA: Diagnosis not present

## 2019-11-26 DIAGNOSIS — M6281 Muscle weakness (generalized): Secondary | ICD-10-CM

## 2019-11-26 NOTE — Patient Instructions (Signed)
Use program 1 for 30 minutes 2 times per day First day lay on back and feel the contraction Second day as you feel the strong stimulation try to contract the muscle manually.   Clean the sensor before and after use Use a small amount on the sensor if you have trouble sliding the sensor in.

## 2019-11-26 NOTE — Patient Instructions (Signed)
We will contact you for a flex sig for 03/2020  MRI of pelvis 03/2020  It was a pleasure to see you today!  Vito Cirigliano, D.O.

## 2019-11-26 NOTE — Progress Notes (Signed)
P  Chief Complaint:    Colon cancer follow-up  GI History: 54 year old female with colonoscopy in 01/2019 performed for diarrhea.  Biopsies were normal/negative for Microscopic Colitis.  2 small right-sided tubular adenomas resected.  A larger 25 mm polyp in the rectosigmoid colon resected.  Pathology demonstrated tubulovillous adenoma with high-grade dysplasia and a small 1 mm focus of adenocarcinoma.  While the adenocarcinoma invaded the superficial submucosa, this was located 3 mm from the cauterized margin, demonstrating clear margins.    This was further evaluated as below:  - Repeat colonoscopy in 09/2019: 2 mm hyperplastic polyp in the sigmoid, tattoo in sigmoid with healthy appearing scar distal to the tattoo.  This was extensively biopsied and negative for adenoma, dysplasia, malignancy -CT chest/abdomen/pelvis (09/2019): 1.2 x 0.9 x 0.9 fluid-filled hepatic cyst, 3 mm hepatic cyst, 4 mm hepatic cyst.  Otherwise normal-appearing liver.  No concerning lesions, e/o metastasis, no lymphadenopathy -MRI pelvis (09/2019): Normal, without lymphadenopathy or residual polyp/lesion -Referral to Dr. Nadeen Landau at [facility] (10/28/2019): No plan for surgical intervention.  Recommend close surveillance with flexible sigmoidoscopy in 3-6 months, repeat MRI pelvis in 6 months, follow-up in [employer] clinic in 3 months  HPI:    Patient is a 53 y.o. female presenting to the Gastroenterology Clinic for follow-up.  Repeat colonoscopy completed last month as outlined above, only notable for a single small hyperplastic polyp.  The previous polypectomy site was seen, with well-healed scar and no residual adenomatous tissue.  Extensive biopsies also demonstrate no residual adenoma, dysplasia, malignancy.  Has subsequently undergone CT C/A/P and MRI pelvis, all without concerning findings.  Was seen by Dr. Dema Severin in [facility], with plan for close surveillance as outlined above and  below.  Today, she states she feels well, and in her usual state of health.  She has no complaints today.  Very thankful for the coordinated care and close follow-up.  Review of systems:     No chest pain, no SOB, no fevers, no urinary sx   Past Medical History:  Diagnosis Date  . Agatston coronary artery calcium score less than 100 2013  . Allergy   . Anxiety   . Environmental allergies   . GERD (gastroesophageal reflux disease)    pt hew symptoms, difficulty swallowing, lump in throat  . Gynecomastia 03/01/2012  . Headache(784.0)   . History of syncope    with low bp orthostatic onset hs  . Hives    chronic  . Hx of colonic polyps    5 year recall history of rectal bleeding  . Hx of skin cancer, basal cell   . Hyperlipidemia   . Internal hemorrhoids   . Migraine   . Urinary incontinence    related to childbirth urge and stress [name] helps    Patient's surgical history, family medical history, social history, medications and allergies were all reviewed in Epic    Current Outpatient Medications  Medication Sig Dispense Refill  . ALPRAZolam (XANAX) 0.25 MG tablet Take 1 tablet (0.25 mg total) by mouth 3 (three) times daily as needed for sleep. For panic attack 20 tablet 0  . APPLE CIDER VINEGAR PO Take 2 tablets by mouth 2 (two) times daily. [place] Apple Cider Vinegar Gummies    . escitalopram (LEXAPRO) 10 MG tablet TAKE 1 TABLET BY MOUTH EVERY DAY (Patient taking differently: Take 10 mg by mouth at bedtime. ) 90 tablet 2  . ibuprofen (ADVIL) 200 MG tablet Take 400 mg by  mouth every 8 (eight) hours as needed for headache.    . levocetirizine (XYZAL) 5 MG tablet Take 5 mg by mouth every evening.    . Multiple Vitamin (MULTIVITAMIN WITH MINERALS) TABS tablet Take 1 tablet by mouth every evening.     No current facility-administered medications for this visit.    Physical Exam:     BP 122/86   Pulse 80   Temp 97.9 F (36.6 C)   Ht 5' 6.5" (1.689 m)   Wt 172 lb 6 oz (78.2  kg)   BMI 27.41 kg/m   GENERAL:  Pleasant female in NAD PSYCH: : Cooperative, normal affect EENT:  conjunctiva pink, mucous membranes moist, neck supple without masses CARDIAC:  RRR, no murmur heard, no peripheral edema PULM: Normal respiratory effort, lungs CTA bilaterally, no wheezing ABDOMEN:  Nondistended, soft, nontender. No obvious masses, no hepatomegaly,  normal bowel sounds SKIN:  turgor, no lesions seen Musculoskeletal:  Normal muscle tone, normal strength NEURO: Alert and oriented x 3, no focal neurologic deficits   IMPRESSION and PLAN:    1) Colon cancer s/p Endoscopic resection 2) History of colon polyps  Colonoscopy in 01/2019 most n/f 25 mm polyp in the rectosigmoid colon which was entirely resected.  Pathology demonstrated tubulovillous adenoma with high-grade dysplasia and a small 1 mm focus of adenocarcinoma.  While the adenocarcinoma invaded the superficial submucosa, this was located 3 mm from the cauterized margin, demonstrating clear margins.  Subsequent imaging to include CT C/A/P and MRI pelvis without concerning findings as outlined above.   Plan for the following:  -Plan for ongoing close surveillance rather than surgical intervention at this juncture -Repeat flexible sigmoidoscopy in 3 to 6 months for surveillance -Repeat pelvic MRI in 6 months -Will follow-up with Dr. Dema Severin in the Colorectal Surgery clinic in about 3 months      I spent over 20 minutes of time, including independent review of results as outlined above, communicating results with the patient directly, face-to-face time with the patient, coordinating care, and ordering studies and medications as appropriate. Greater than 50% of the time was spent counseling and coordinating care.       Lavena Bullion ,DO, FACG 11/26/2019, 10:55 AM

## 2019-11-26 NOTE — Therapy (Signed)
[facility] [address], [place], [place], [address] Phone: [phone]   Fax:  [phone]  Physical Therapy Treatment  Patient Details  Name: Tamara Watkins MRN: [id number] Date of Birth: 11/08/1966 Referring Provider (PT): Dr. Nadeen Landau   Encounter Date: 11/26/2019  PT End of Session - 11/26/19 1401    Visit Number  2    Date for PT Re-Evaluation  01/23/20    Authorization Type  [employer]    PT Start Time  1400    PT Stop Time  1440    PT Time Calculation (min)  40 min    Activity Tolerance  Patient tolerated treatment well    Behavior During Therapy  [facility] for tasks assessed/performed       Past Medical History:  Diagnosis Date  . Agatston coronary artery calcium score less than 100 2013  . Allergy   . Anxiety   . Environmental allergies   . GERD (gastroesophageal reflux disease)    pt hew symptoms, difficulty swallowing, lump in throat  . Gynecomastia 03/01/2012  . Headache(784.0)   . History of syncope    with low bp orthostatic onset hs  . Hives    chronic  . Hx of colonic polyps    5 year recall history of rectal bleeding  . Hx of skin cancer, basal cell   . Hyperlipidemia   . Internal hemorrhoids   . Migraine   . Urinary incontinence    related to childbirth urge and stress [name] helps    Past Surgical History:  Procedure Laterality Date  . BIOPSY  10/07/2019   Procedure: BIOPSY;  Surgeon: Lavena Bullion, DO;  Location: [facility];  Service: Gastroenterology;;  . CESAREAN SECTION    . COLONOSCOPY WITH PROPOFOL N/A 10/07/2019   Procedure: COLONOSCOPY WITH PROPOFOL;  Surgeon: Lavena Bullion, DO;  Location: [facility];  Service: Gastroenterology;  Laterality: N/A;  . DESTRUCTION OF LESION ANAL    . DILATION AND CURETTAGE OF UTERUS     for miscarrage  . REDUCTION MAMMAPLASTY  2013    There were no vitals filed for this visit.  Subjective Assessment - 11/26/19 1400    Subjective  I got the stimulator for the pelvic floor. I have been using the moisturizer and massage the perineum. No changes yet.    Patient Stated Goals  strengthen pelvic floor to reduce urge incontinence    Currently in Pain?  No/denies    Multiple Pain Sites  No                    Pelvic Floor Special Questions - 11/26/19 0001    Pelvic Floor Internal Exam  Patient confirms identification and approves PT to assess pelvic floor and treatemtn    Exam Type  Vaginal    Palpation  usign 2 fingers able to feel a pelvic floor contraction and patient able to feel it too    Strength  Flicker        [facility] Adult PT Treatment/Exercise - 11/26/19 0001      Self-Care   Self-Care  Other Self-Care Comments      Neuro Re-ed    Neuro Re-ed Details   while therapist has her index finger in the vaginal canal giving tactile cues to the vaginal canal to promote pelvic floor contraction in sidely, resistive hip flexion holding for 5 seconds with pelvic floor contraction      Manual Therapy   Manual Therapy  Internal Pelvic Floor    Internal Pelvic Floor  soft tissue work to the bulbocavernosus, ischiocavernosus, levator ani, obturator internist, urethra spincter in left sidely             PT Education - 11/26/19 1440    Education Details  how to use the vaginal muscle stimulator to strengthen the pelvic floor muscles    Person(s) Educated  Patient    Methods  Explanation;Demonstration;Handout    Comprehension  Returned demonstration;Verbalized understanding       PT Short Term Goals - 11/26/19 1444      PT SHORT TERM GOAL #1   Title  independent with initial HEP    Time  4    Period  Weeks    Status  On-going    Target Date  11/28/19      PT SHORT TERM GOAL #2   Title  understand how to perform perineal massage to improve tissue mobility    Time  4    Period  Weeks    Status  Achieved    Target Date  11/28/19      PT SHORT TERM GOAL #3   Title  understand how to  use the vaginal electrical stimulation unit to work on pelvic floor contraction    Time  4    Period  Weeks    Status  On-going      PT SHORT TERM GOAL #4   Title  pelvic floor strength >/= 1/5 due to elongation of pelvic floor tissue    Time  4    Period  Weeks    Status  Achieved    Target Date  11/28/19        PT Long Term Goals - 10/31/19 0937      PT LONG TERM GOAL #1   Title  independent with advanced exercises    Time  12    Period  Weeks    Status  New    Target Date  01/23/20      PT LONG TERM GOAL #2   Title  pelvic floor strength >/= 3/5 holding for 15 seconds due to elongation of tissue and ability to contract    Time  12    Period  Weeks    Status  New    Target Date  01/23/20      PT LONG TERM GOAL #3   Title  urge to void reduced >/= 80% due to ability to contract her pelvic floor muscles and able to walk to the bathroom slowly    Time  12    Period  Weeks    Status  New    Target Date  01/23/20      PT LONG TERM GOAL #4   Title  understand ways to manage prolapse to reduce further progression    Time  12    Period  Weeks    Status  New    Target Date  01/23/20      PT LONG TERM GOAL #5   Title  able to contract the pelvic floor without bearing down due to improve coordination of the pelvic floor muscles    Time  12    Period  Weeks    Status  New    Target Date  01/23/20            Plan - 11/26/19 1440    Clinical Impression Statement  Patient understands how to use the pelvic floor muscle stimulator,  how to change the settings, how to increase the intensity, and how to clean the sensor. Patient pelvic floor strength is now 1/5 when using two fingers vaginally. Patient pelvic floor muscles are more supple. Patient has been using the vaginal moisturizer. Patient will benefit from skilled therapy to improve pelvic floor strength and coordination and core strength.    Personal Factors and Comorbidities  Comorbidity 1;Comorbidity 2;Time since  onset of injury/illness/exacerbation    Comorbidities  grade 4 tear with first child, rectal cancer    Examination-Activity Limitations  Continence;Toileting    Examination-Participation Restrictions  Interpersonal Relationship    Stability/Clinical Decision Making  Evolving/Moderate complexity    Rehab Potential  Excellent    PT Frequency  1x / week    PT Duration  12 weeks    PT Treatment/Interventions  Biofeedback;Therapeutic activities;Therapeutic exercise;Patient/family education;Neuromuscular re-education;Manual techniques;Scar mobilization;Dry needling    PT Next Visit Plan  correct pelvis, resistive hip flexion with pelvic floor contraction, hip in and out with pelvic floor contraction, see how it is going with the stim unit    Recommended Other Services  MD signed intial eval    Consulted and Agree with Plan of Care  Patient       Patient will benefit from skilled therapeutic intervention in order to improve the following deficits and impairments:  Decreased coordination, Increased fascial restricitons, Decreased endurance, Decreased scar mobility, Decreased strength  Visit Diagnosis: Muscle weakness (generalized)  Unspecified lack of coordination  Urge incontinence     Problem List Patient Active Problem List   Diagnosis Date Noted  . Tubulovillous adenoma polyp of rectum   . Polyp of sigmoid colon   . Galactorrhea not associated with childbirth 09/25/2018  . Menorrhagia 09/25/2018  . Vulvitis 09/25/2018  . Breakthrough bleeding mid cycle  01/27/2015  . Irregular menstrual cycle 06/08/2014  . Weight gain 06/08/2014  . Hoarseness 06/08/2014  . Sore throat 06/08/2014  . Sleep disturbance 06/08/2014  . Abnormal skin color 10/30/2012  . Agatston coronary artery calcium score less than 100   . Neck pain, chronic 03/01/2012  . Chronic upper back pain 03/01/2012  . Allergic rhinitis, seasonal 03/01/2012  . Cervicogenic migraine 03/01/2012  . Encounter for preventive  health examination 05/22/2011  . Goiter 04/28/2011  . POSTNASAL DRIP SYNDROME 08/05/2010  . CERVICAL LYMPHADENOPATHY, RIGHT 08/05/2010  . FATIGUE 04/09/2009  . Hyperlipidemia 03/09/2009  . PANIC DISORDER 03/09/2009  . URINARY INCONTINENCE 03/09/2009  . SKIN CANCER, HX OF 03/09/2009  . History of colonic polyps 03/09/2009    Earlie Counts, PT 11/26/19 2:45 PM   [facility] [address], [place], [place], [address] Phone: [phone]   Fax:  [phone]  Name: Tamara Watkins MRN: [id number] Date of Birth: 1966/08/11

## 2019-12-05 ENCOUNTER — Ambulatory Visit: Payer: 59 | Admitting: Physical Therapy

## 2019-12-12 ENCOUNTER — Other Ambulatory Visit: Payer: Self-pay

## 2019-12-12 ENCOUNTER — Ambulatory Visit: Payer: 59 | Attending: Surgery | Admitting: Physical Therapy

## 2019-12-12 ENCOUNTER — Encounter: Payer: Self-pay | Admitting: Physical Therapy

## 2019-12-12 DIAGNOSIS — R279 Unspecified lack of coordination: Secondary | ICD-10-CM | POA: Diagnosis present

## 2019-12-12 DIAGNOSIS — N3941 Urge incontinence: Secondary | ICD-10-CM | POA: Diagnosis present

## 2019-12-12 DIAGNOSIS — M6281 Muscle weakness (generalized): Secondary | ICD-10-CM | POA: Insufficient documentation

## 2019-12-12 NOTE — Therapy (Addendum)
[facility] [address], [place], [place], [address] Phone: [phone]   Fax:  [phone]  Physical Therapy Treatment  Patient Details  Name: Tamara Watkins MRN: [id number] Date of Birth: 1966-07-18 Referring Provider (PT): Dr. Nadeen Landau   Encounter Date: 12/12/2019  PT End of Session - 12/12/19 0924    Visit Number  3    Date for PT Re-Evaluation  01/23/20    Authorization Type  [employer]    PT Start Time  0845    PT Stop Time  0923    PT Time Calculation (min)  38 min    Activity Tolerance  Patient tolerated treatment well    Behavior During Therapy  [facility] for tasks assessed/performed       Past Medical History:  Diagnosis Date  . Agatston coronary artery calcium score less than 100 2013  . Allergy   . Anxiety   . Environmental allergies   . GERD (gastroesophageal reflux disease)    pt hew symptoms, difficulty swallowing, lump in throat  . Gynecomastia 03/01/2012  . Headache(784.0)   . History of syncope    with low bp orthostatic onset hs  . Hives    chronic  . Hx of colonic polyps    5 year recall history of rectal bleeding  . Hx of skin cancer, basal cell   . Hyperlipidemia   . Internal hemorrhoids   . Migraine   . Urinary incontinence    related to childbirth urge and stress [name] helps    Past Surgical History:  Procedure Laterality Date  . BIOPSY  10/07/2019   Procedure: BIOPSY;  Surgeon: Lavena Bullion, DO;  Location: [facility];  Service: Gastroenterology;;  . CESAREAN SECTION    . COLONOSCOPY WITH PROPOFOL N/A 10/07/2019   Procedure: COLONOSCOPY WITH PROPOFOL;  Surgeon: Lavena Bullion, DO;  Location: [facility];  Service: Gastroenterology;  Laterality: N/A;  . DESTRUCTION OF LESION ANAL    . DILATION AND CURETTAGE OF UTERUS     for miscarrage  . REDUCTION MAMMAPLASTY  2013    There were no vitals filed for this visit.  Subjective Assessment - 12/12/19 0848    Subjective   I am using it I am notice a big difference in my muscle control. I am leaking urine alot less. I would like to come every couple of weeks.    Patient Stated Goals  strengthen pelvic floor to reduce urge incontinence    Currently in Pain?  No/denies    Multiple Pain Sites  No         [facility] PT Assessment - 12/12/19 0001      Palpation   SI assessment   pelvis in correct alignment                Pelvic Floor Special Questions - 12/12/19 0001    Pelvic Floor Internal Exam  Patient confirms identification and approves PT to assess pelvic floor and treatemtn    Exam Type  Vaginal    Strength  weak squeeze, no lift   using hypopressive technique       [facility] Adult PT Treatment/Exercise - 12/12/19 0001      Self-Care   Self-Care  Other Self-Care Comments    Other Self-Care Comments   edcuation on scar massage and how to mobilize it      Neuro Re-ed    Neuro Re-ed Details   with therapist finger in the vaginal canal giving the pelvic  floor tactile cues and usin gthe hypopressive tecnique with deep breath in an dpull up pelvic floor      Lumbar Exercises: Supine   Clam  10 reps;1 second    Clam Limitations  both sides     Bent Knee Raise  20 reps;5 seconds    Bent Knee Raise Limitations  with abdominal contraction      Manual Therapy   Manual Therapy  Internal Pelvic Floor;Joint mobilization    Joint Mobilization  P-A and rotational mobilization to T5-L5 to improve diaphragmatic movement; posterior rib mobilization to improve opening of the rib cage, supine rib cage downward movement    Internal Pelvic Floor  bil. levator ani, tactile cues with pelvic floor contraction             PT Education - 12/12/19 0902    Education Details  Access Code: [email or web address]; c-section scar massage    Person(s) Educated  Patient    Methods  Explanation;Demonstration;Verbal cues;Handout    Comprehension  Verbalized understanding;Returned demonstration       PT Short Term Goals -  12/12/19 0929      PT SHORT TERM GOAL #1   Title  independent with initial HEP    Time  4    Period  Weeks    Status  Achieved      PT SHORT TERM GOAL #2   Title  understand how to perform perineal massage to improve tissue mobility    Time  4    Period  Weeks    Status  Achieved      PT SHORT TERM GOAL #3   Title  understand how to use the vaginal electrical stimulation unit to work on pelvic floor contraction    Time  4    Period  Weeks    Status  Achieved      PT SHORT TERM GOAL #4   Title  pelvic floor strength >/= 1/5 due to elongation of pelvic floor tissue    Time  4    Period  Weeks    Status  Achieved        PT Long Term Goals - 10/31/19 9390      PT LONG TERM GOAL #1   Title  independent with advanced exercises    Time  12    Period  Weeks    Status  New    Target Date  01/23/20      PT LONG TERM GOAL #2   Title  pelvic floor strength >/= 3/5 holding for 15 seconds due to elongation of tissue and ability to contract    Time  12    Period  Weeks    Status  New    Target Date  01/23/20      PT LONG TERM GOAL #3   Title  urge to void reduced >/= 80% due to ability to contract her pelvic floor muscles and able to walk to the bathroom slowly    Time  12    Period  Weeks    Status  New    Target Date  01/23/20      PT LONG TERM GOAL #4   Title  understand ways to manage prolapse to reduce further progression    Time  12    Period  Weeks    Status  New    Target Date  01/23/20      PT LONG TERM GOAL #5   Title  able to contract  the pelvic floor without bearing down due to improve coordination of the pelvic floor muscles    Time  12    Period  Weeks    Status  New    Target Date  01/23/20            Plan - 12/12/19 0849    Clinical Impression Statement  Patient is using her electrical stimulation unit and notices a reduction in her urinary leakage. Patient pelvic floor strength has improved to 2/5 when using the hypopressive technique.  Patient vaginal tissue is moister. Patient has more suppleness in the pelvic floor muscles due to increased blood flow from using the electrical stimuation unit. Patient will benefit from skilled therapy to improve pelvic floor strength and coordination and core strength.    Personal Factors and Comorbidities  Comorbidity 1;Comorbidity 2;Time since onset of injury/illness/exacerbation    Comorbidities  grade 4 tear with first child, rectal cancer    Examination-Activity Limitations  Continence;Toileting    Examination-Participation Restrictions  Interpersonal Relationship    Stability/Clinical Decision Making  Evolving/Moderate complexity    Rehab Potential  Excellent    PT Frequency  1x / week    PT Duration  12 weeks    PT Treatment/Interventions  Biofeedback;Therapeutic activities;Therapeutic exercise;Patient/family education;Neuromuscular re-education;Manual techniques;Scar mobilization;Dry needling    PT Next Visit Plan  pelvic floor contraction in sitting with holding 5 sec and quick flicks, add heel slides, quadruped lift extremity, bug for pelvic floor and core strength    PT Home Exercise Plan  Access Code: [email or web address]    Consulted and Agree with Plan of Care  Patient       Patient will benefit from skilled therapeutic intervention in order to improve the following deficits and impairments:  Decreased coordination, Increased fascial restricitons, Decreased endurance, Decreased scar mobility, Decreased strength  Visit Diagnosis: Muscle weakness (generalized)  Unspecified lack of coordination  Urge incontinence     Problem List Patient Active Problem List   Diagnosis Date Noted  . Tubulovillous adenoma polyp of rectum   . Polyp of sigmoid colon   . Galactorrhea not associated with childbirth 09/25/2018  . Menorrhagia 09/25/2018  . Vulvitis 09/25/2018  . Breakthrough bleeding mid cycle  01/27/2015  . Irregular menstrual cycle 06/08/2014  . Weight gain 06/08/2014  .  Hoarseness 06/08/2014  . Sore throat 06/08/2014  . Sleep disturbance 06/08/2014  . Abnormal skin color 10/30/2012  . Agatston coronary artery calcium score less than 100   . Neck pain, chronic 03/01/2012  . Chronic upper back pain 03/01/2012  . Allergic rhinitis, seasonal 03/01/2012  . Cervicogenic migraine 03/01/2012  . Encounter for preventive health examination 05/22/2011  . Goiter 04/28/2011  . POSTNASAL DRIP SYNDROME 08/05/2010  . CERVICAL LYMPHADENOPATHY, RIGHT 08/05/2010  . FATIGUE 04/09/2009  . Hyperlipidemia 03/09/2009  . PANIC DISORDER 03/09/2009  . URINARY INCONTINENCE 03/09/2009  . SKIN CANCER, HX OF 03/09/2009  . History of colonic polyps 03/09/2009    Earlie Counts, PT 12/12/19 9:31 AM    [facility] [address] [place], [place], [address] Phone: [phone]   Fax:  [phone]  Name: Tamara Watkins MRN: [id number] Date of Birth: 1966-06-28 PHYSICAL THERAPY DISCHARGE SUMMARY  Visits from Start of Care: 3  Current functional level related to goals / functional outcomes: See above. Patient did not return to therapy. On the last session she was educated on home electrical stimulation unit for the pelvic floor and felt it helped her a lot.  Remaining deficits: See above.    Education / Equipment: HEP Plan:                                                    Patient goals were partially met. Patient is being discharged due to not returning since the last visit. Thank you for the referral. Earlie Counts, PT 01/26/20 2:07 PM   ?????

## 2019-12-12 NOTE — Patient Instructions (Signed)
Access Code: [email or web address]  URL: [email or web address]  Date: 12/12/2019  Prepared by: Earlie Counts   Exercises Hooklying Isometric Hip Flexion - 10 reps - 5 sec hold - 1x daily - 7x weekly Bent Knee Fallouts - 10 reps - 1 sets - 1x daily - 7x weekly Supine Bridge - 10 reps - 2 sets - 1x daily - 7x weekly [facility] [address] [place], [place] [address] Phone # [phone] Fax [phone]

## 2020-01-19 ENCOUNTER — Ambulatory Visit: Payer: 59 | Attending: Surgery | Admitting: Physical Therapy

## 2020-01-19 ENCOUNTER — Telehealth: Payer: Self-pay | Admitting: Physical Therapy

## 2020-01-19 NOTE — Telephone Encounter (Signed)
Called patient about her appoint she no-showed today at [place], PT @2 /22/2021@ 10:01 AM

## 2020-02-21 IMAGING — CT CT CHEST W/ CM
2 of 5 series · 13 of 36 positions shown, 16 images · IV contrast (OMNIPAQUE)
Comparison: None.

CLINICAL DATA: Rectosigmoid adenocarcinoma/dysplasia staging.

EXAM:
CT CHEST, ABDOMEN, AND PELVIS WITH CONTRAST
TECHNIQUE: Multidetector CT imaging of the chest, abdomen and pelvis was
performed following the standard protocol during bolus
administration of intravenous contrast.
CONTRAST:  100mL OMNIPAQUE IOHEXOL 300 MG/ML  SOLN

[Series 2: cap with · axial · 0.80mm/px · z∈[-261,+279]mm · 10 of 132 slices shown, 13 images]
[im 12/132  mediastinal]
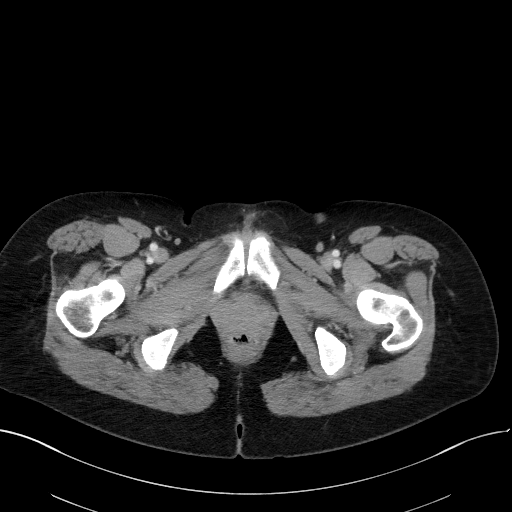
[im 12/132  lung]
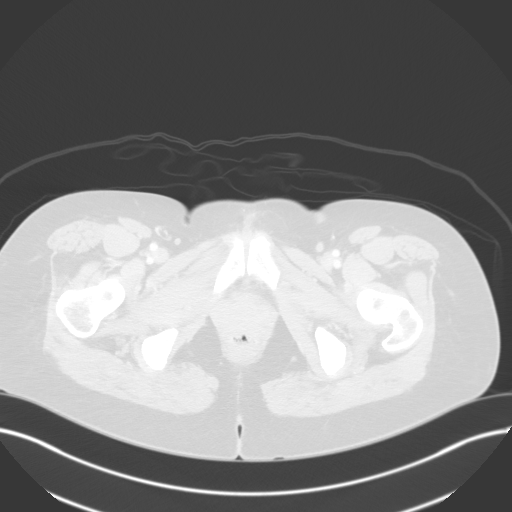
[im 24/132  lung]
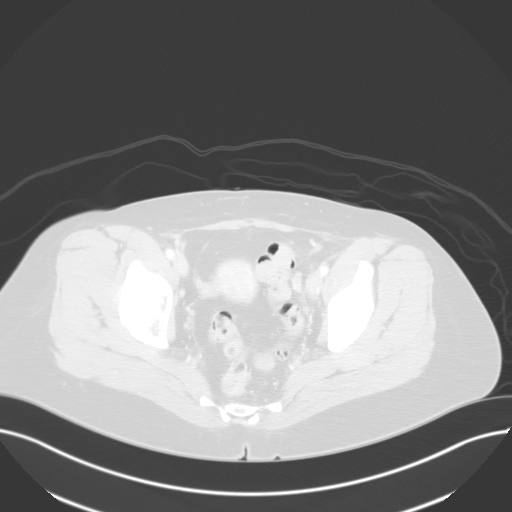
[im 36/132  lung]
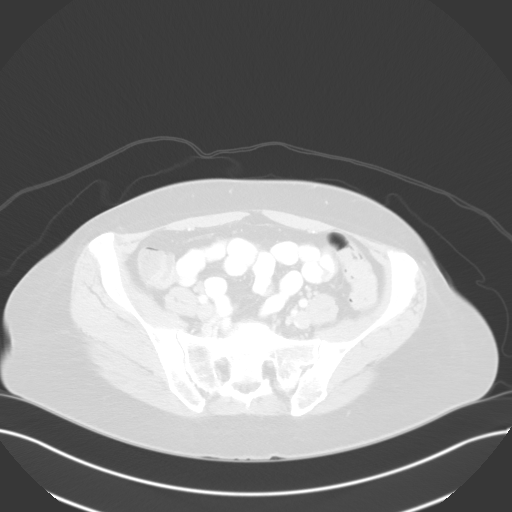
[im 48/132  lung]
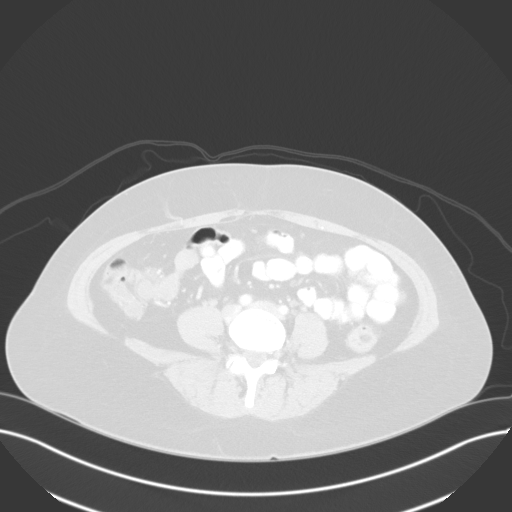
[im 60/132  mediastinal]
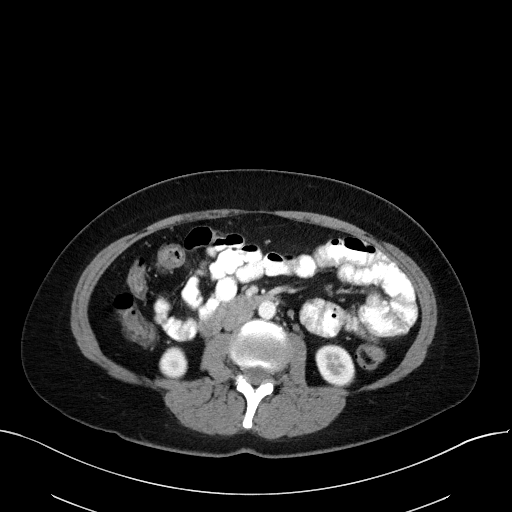
[im 60/132  lung]
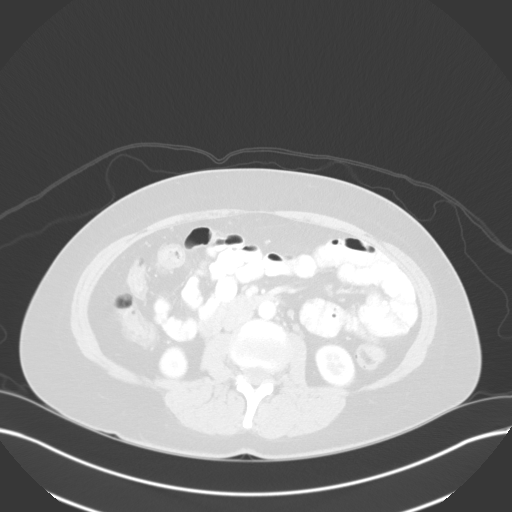
[im 72/132  lung]
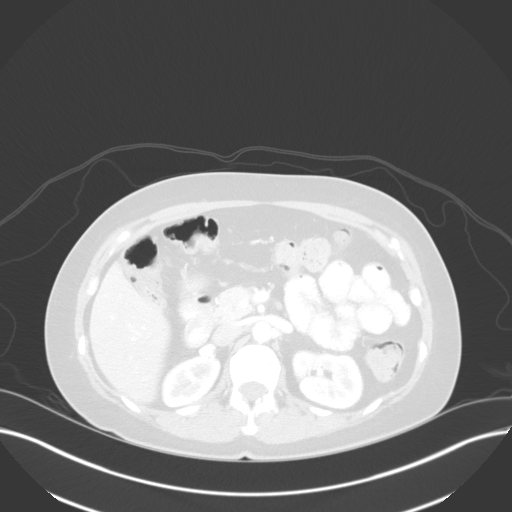
[im 84/132  lung]
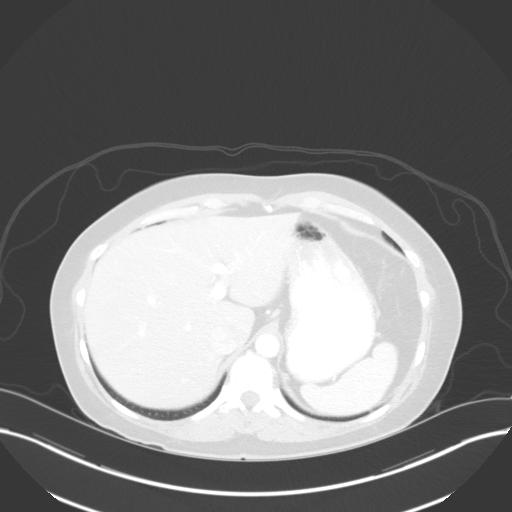
[im 96/132  lung]
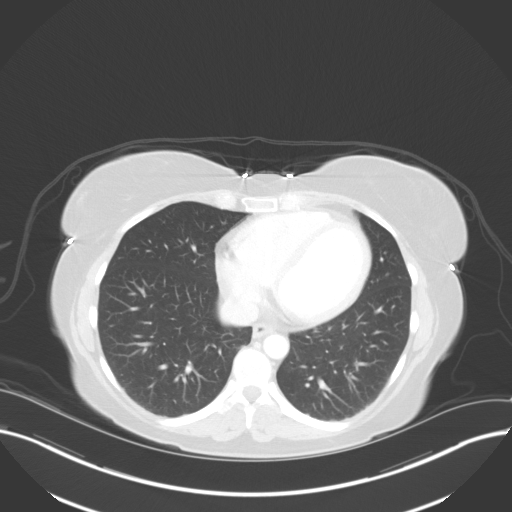
[im 108/132  mediastinal]
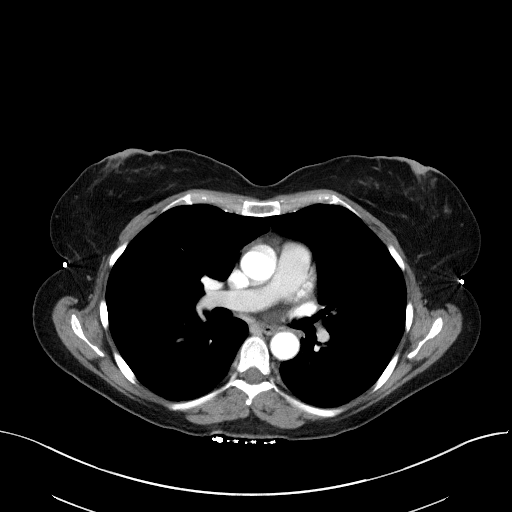
[im 108/132  lung]
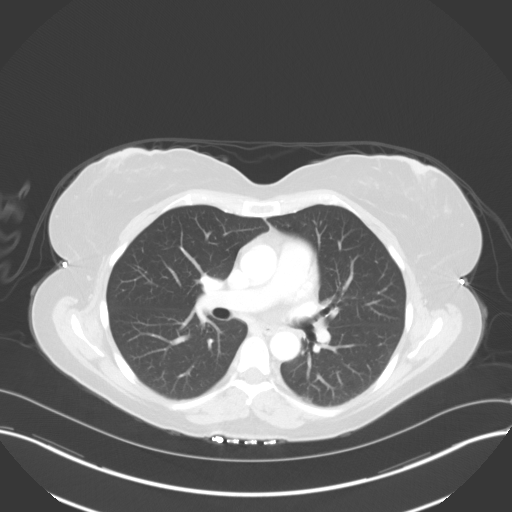
[im 120/132  lung]
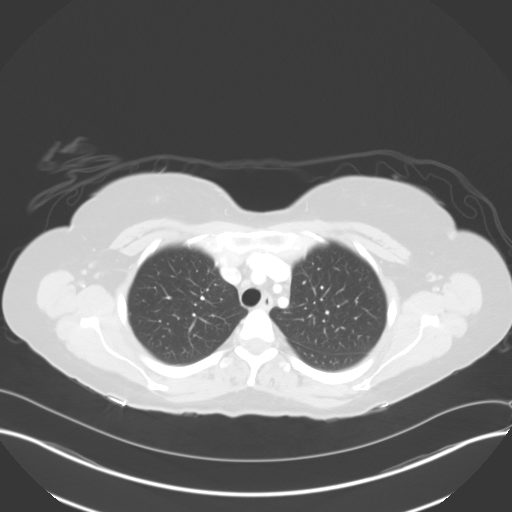

[Series 4: coronals · coronal · 0.92mm/px · 3 of 104 slices shown]
[im 21/104  lung]
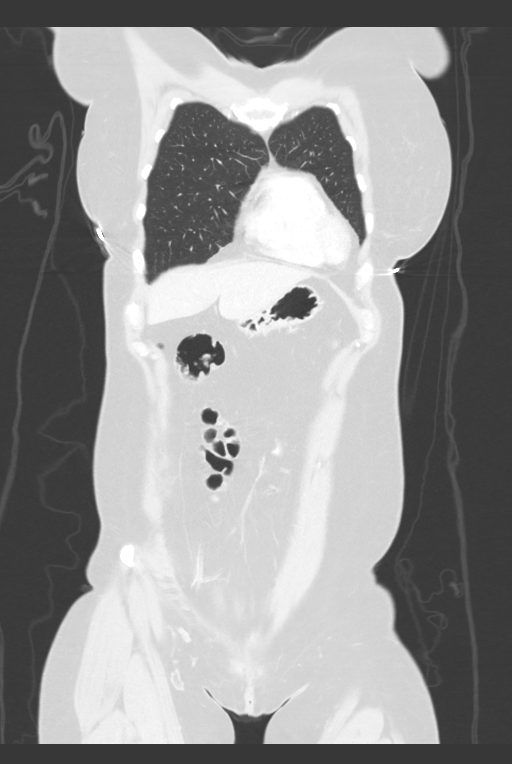
[im 42/104  lung]
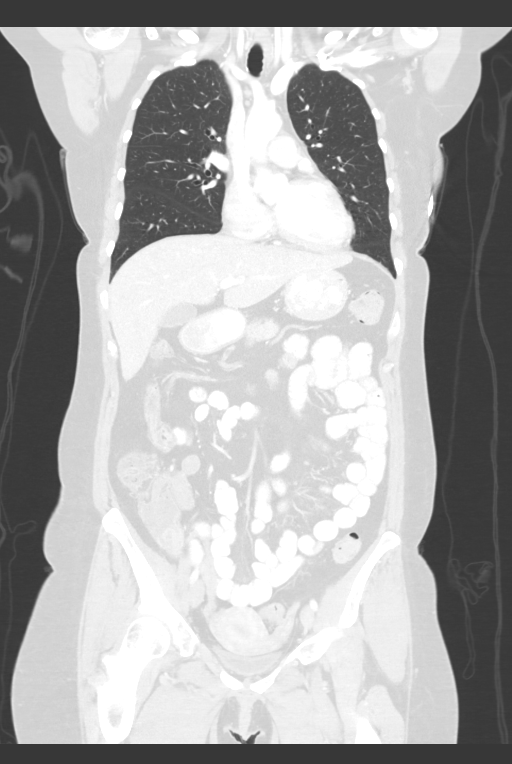
[im 62/104  lung]
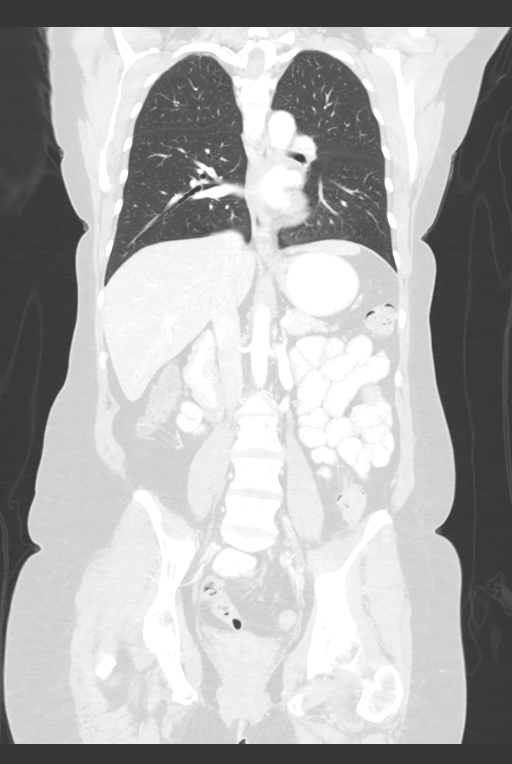

[13 of 36 positions shown; findings below may reference images not displayed]

FINDINGS: CT CHEST FINDINGS

Cardiovascular: Unremarkable

Mediastinum/Nodes: Unremarkable

Lungs/Pleura: Unremarkable

Musculoskeletal: Unremarkable

CT ABDOMEN PELVIS FINDINGS

Hepatobiliary: Homogeneously hypodense 1.2 by 0.9 by 0.9 cm fluid
density lesion in the right hepatic lobe on image 45/2, highly
likely to be a cyst. 3 mm right hepatic lobe lesion also on image
45/2, technically too small to characterize but likely a cyst.
Similar 4 mm lesion in the right hepatic lobe on image 55/2. No
appreciable lesion with specific worrisome characteristics.

Gallbladder unremarkable. No biliary dilatation.

Pancreas: Unremarkable

Spleen: Unremarkable

Adrenals/Urinary Tract: Unremarkable

Stomach/Bowel: The rectosigmoid lesion seen at colonoscopy is not
well appreciated on today's CT examination. No local surrounding
adenopathy identified.

Vascular/Lymphatic: No significant atherosclerosis. No pathologic
adenopathy identified.

Reproductive: Rim enhancing 1.8 cm left ovarian structures probably
a corpus luteum. No significant abnormality observed.

Other: No supplemental non-categorized findings.

Musculoskeletal: Unremarkable
IMPRESSION: 1. The rectosigmoid lesion seen at colonoscopy is not well
appreciated on today's CT examination. No findings of metastatic
disease to the chest, abdomen, or pelvis.
2. Several small hypodense lesions in the liver are technically too
small to characterize but statistically likely to be cysts.

## 2020-02-21 IMAGING — CT CT ABD-PELV W/ CM
2 of 5 series · 13 of 36 positions shown, 16 images · IV contrast (omnipaque)
Comparison: None.

CLINICAL DATA: Rectosigmoid adenocarcinoma/dysplasia staging.

EXAM:
CT CHEST, ABDOMEN, AND PELVIS WITH CONTRAST
TECHNIQUE: Multidetector CT imaging of the chest, abdomen and pelvis was
performed following the standard protocol during bolus
administration of intravenous contrast.
CONTRAST:  100mL OMNIPAQUE IOHEXOL 300 MG/ML  SOLN

[Series 2: cap with · axial · 0.80mm/px · z∈[-261,+279]mm · 10 of 132 slices shown, 13 images]
[im 12/132  mediastinal]
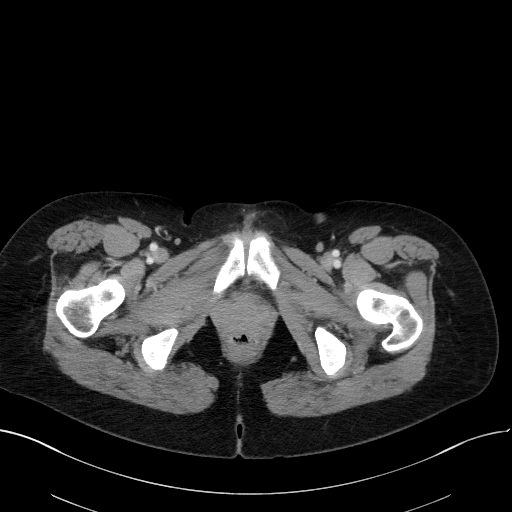
[im 12/132  lung]
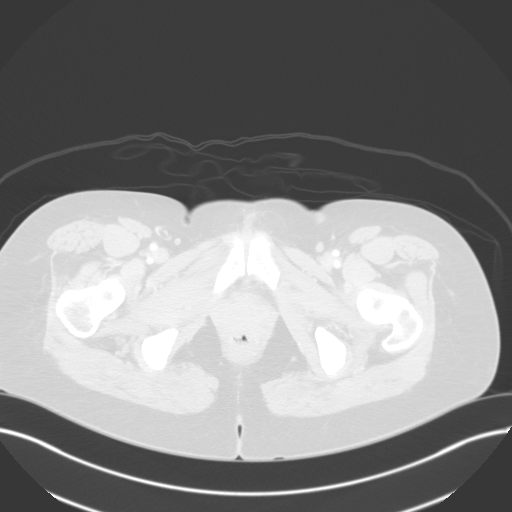
[im 24/132  lung]
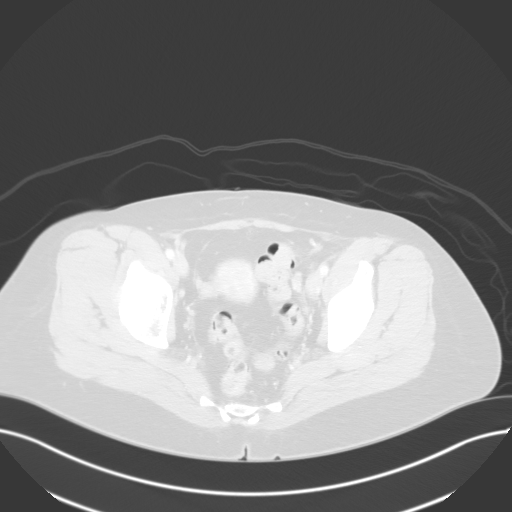
[im 36/132  lung]
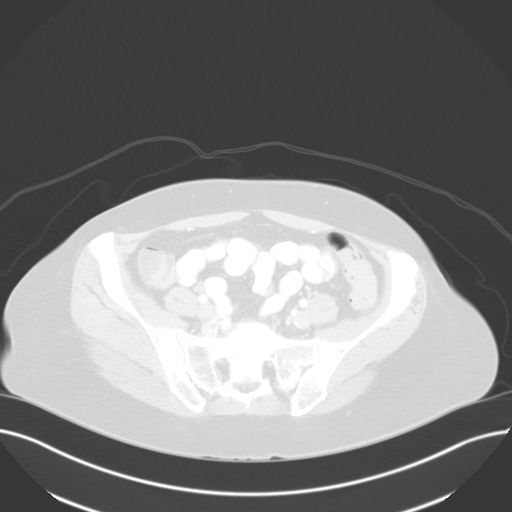
[im 48/132  lung]
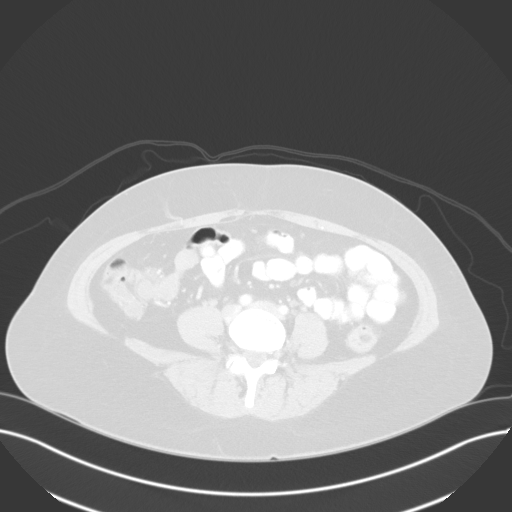
[im 60/132  mediastinal]
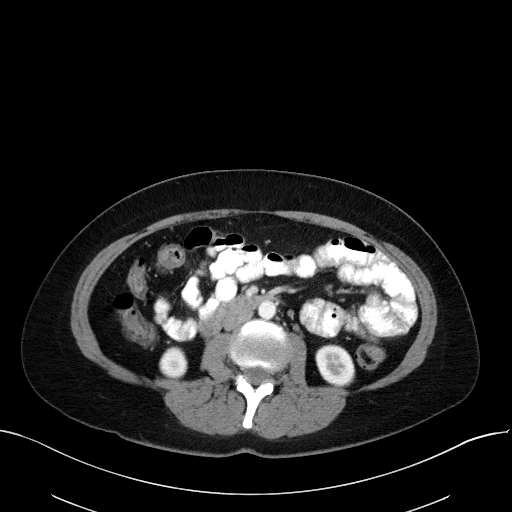
[im 60/132  lung]
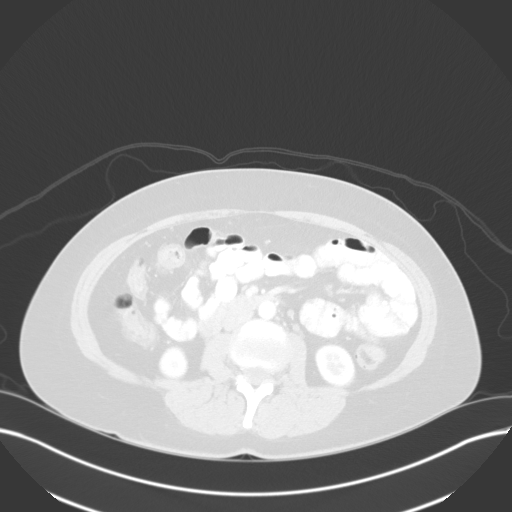
[im 72/132  lung]
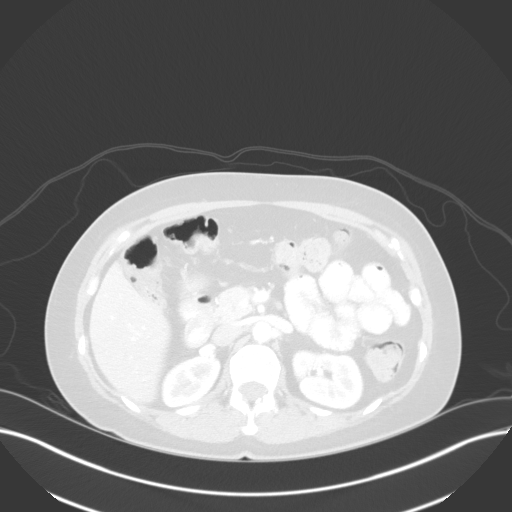
[im 84/132  lung]
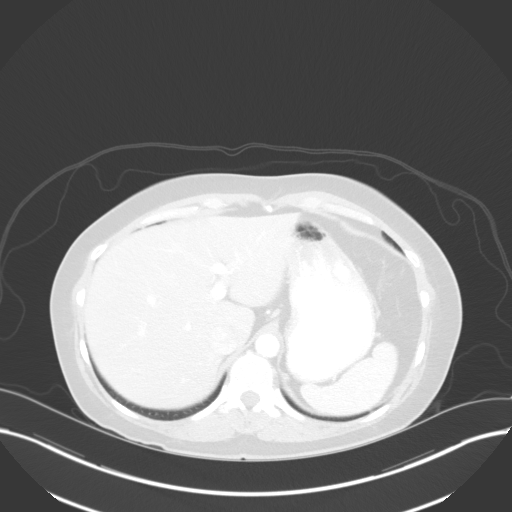
[im 96/132  lung]
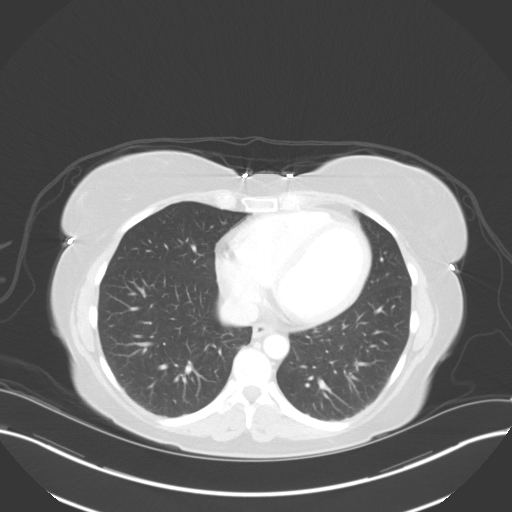
[im 108/132  mediastinal]
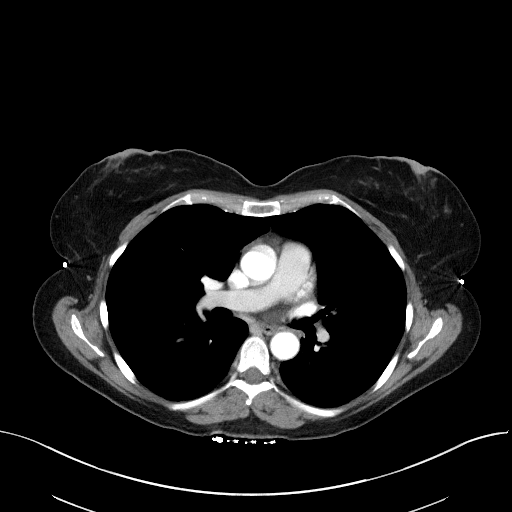
[im 108/132  lung]
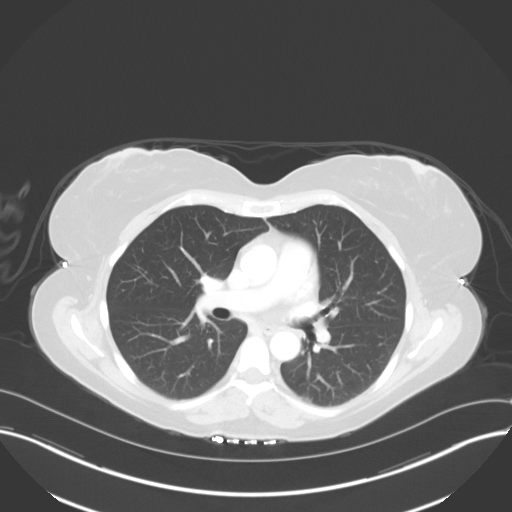
[im 120/132  lung]
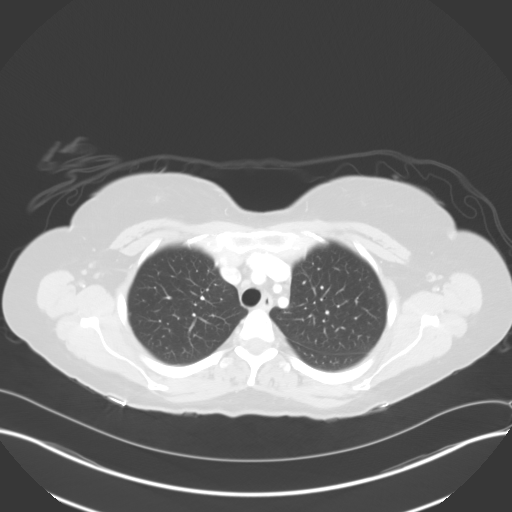

[Series 4: coronals · coronal · 0.92mm/px · 3 of 104 slices shown]
[im 21/104  lung]
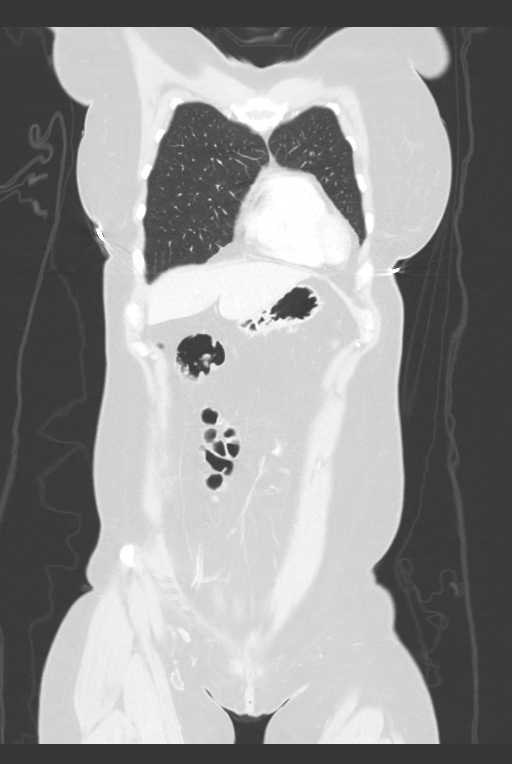
[im 42/104  lung]
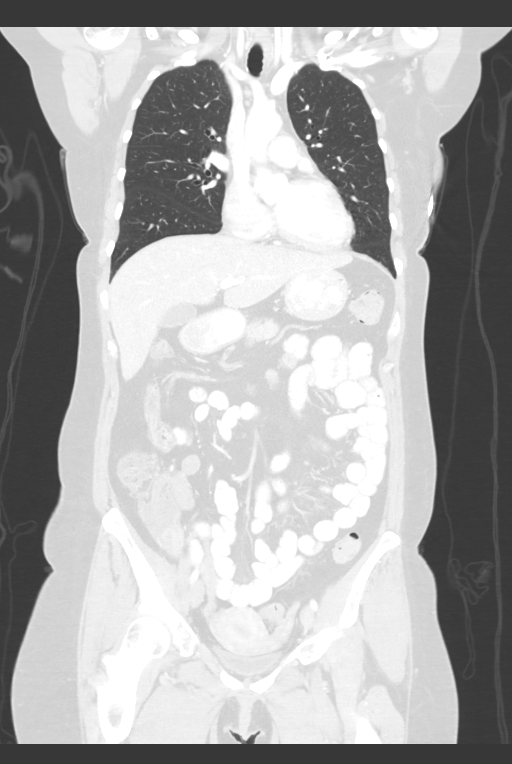
[im 62/104  lung]
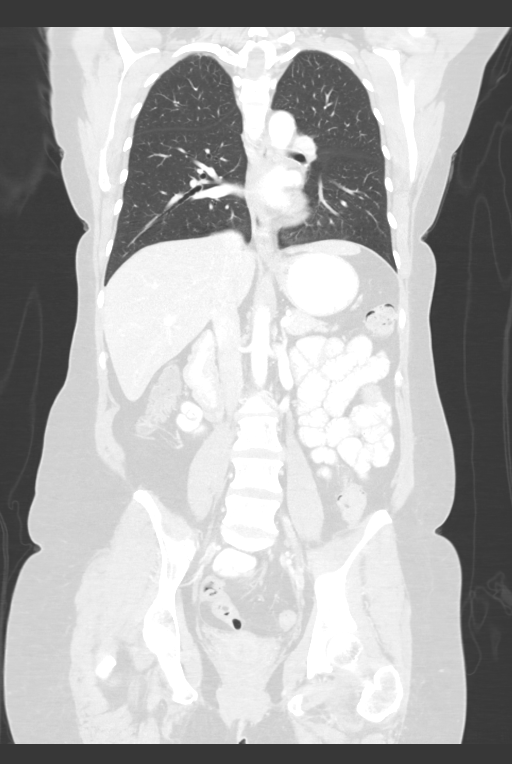

[13 of 36 positions shown; findings below may reference images not displayed]

FINDINGS: CT CHEST FINDINGS

Cardiovascular: Unremarkable

Mediastinum/Nodes: Unremarkable

Lungs/Pleura: Unremarkable

Musculoskeletal: Unremarkable

CT ABDOMEN PELVIS FINDINGS

Hepatobiliary: Homogeneously hypodense 1.2 by 0.9 by 0.9 cm fluid
density lesion in the right hepatic lobe on image 45/2, highly
likely to be a cyst. 3 mm right hepatic lobe lesion also on image
45/2, technically too small to characterize but likely a cyst.
Similar 4 mm lesion in the right hepatic lobe on image 55/2. No
appreciable lesion with specific worrisome characteristics.

Gallbladder unremarkable. No biliary dilatation.

Pancreas: Unremarkable

Spleen: Unremarkable

Adrenals/Urinary Tract: Unremarkable

Stomach/Bowel: The rectosigmoid lesion seen at colonoscopy is not
well appreciated on today's CT examination. No local surrounding
adenopathy identified.

Vascular/Lymphatic: No significant atherosclerosis. No pathologic
adenopathy identified.

Reproductive: Rim enhancing 1.8 cm left ovarian structures probably
a corpus luteum. No significant abnormality observed.

Other: No supplemental non-categorized findings.

Musculoskeletal: Unremarkable
IMPRESSION: 1. The rectosigmoid lesion seen at colonoscopy is not well
appreciated on today's CT examination. No findings of metastatic
disease to the chest, abdomen, or pelvis.
2. Several small hypodense lesions in the liver are technically too
small to characterize but statistically likely to be cysts.

## 2020-02-26 ENCOUNTER — Telehealth: Payer: Self-pay | Admitting: Gastroenterology

## 2020-02-26 ENCOUNTER — Other Ambulatory Visit: Payer: Self-pay | Admitting: Gastroenterology

## 2020-02-26 DIAGNOSIS — C187 Malignant neoplasm of sigmoid colon: Secondary | ICD-10-CM

## 2020-02-26 NOTE — Telephone Encounter (Signed)
Patient called said she received a call today from someone about scheduling a colon but in the system she has recall for November.

## 2020-03-22 ENCOUNTER — Ambulatory Visit (AMBULATORY_SURGERY_CENTER): Payer: Self-pay | Admitting: *Deleted

## 2020-03-22 ENCOUNTER — Other Ambulatory Visit: Payer: Self-pay

## 2020-03-22 VITALS — Temp 97.6°F | Ht 66.0 in | Wt 161.0 lb

## 2020-03-22 DIAGNOSIS — C187 Malignant neoplasm of sigmoid colon: Secondary | ICD-10-CM

## 2020-03-22 NOTE — Progress Notes (Signed)

## 2020-03-30 ENCOUNTER — Ambulatory Visit (INDEPENDENT_AMBULATORY_CARE_PROVIDER_SITE_OTHER): Payer: 59

## 2020-03-30 DIAGNOSIS — Z1159 Encounter for screening for other viral diseases: Secondary | ICD-10-CM

## 2020-04-01 ENCOUNTER — Ambulatory Visit (AMBULATORY_SURGERY_CENTER): Payer: 59 | Admitting: Gastroenterology

## 2020-04-01 ENCOUNTER — Encounter: Payer: Self-pay | Admitting: Gastroenterology

## 2020-04-01 ENCOUNTER — Other Ambulatory Visit: Payer: Self-pay

## 2020-04-01 VITALS — BP 108/63 | HR 70 | Temp 97.8°F | Resp 14 | Ht 66.0 in | Wt 172.0 lb

## 2020-04-01 DIAGNOSIS — C189 Malignant neoplasm of colon, unspecified: Secondary | ICD-10-CM | POA: Diagnosis not present

## 2020-04-01 MED ORDER — SODIUM CHLORIDE 0.9 % IV SOLN
500.0000 mL | Freq: Once | INTRAVENOUS | Status: DC
Start: 2020-04-01 — End: 2020-04-01

## 2020-04-01 NOTE — Patient Instructions (Addendum)
You will need a repeat colonoscopy in 1 year per Dr. Bryan Lemma.  Thank-you for choosing [place] for your healthcare needs today.  YOU HAD AN ENDOSCOPIC PROCEDURE TODAY AT [facility]:   Refer to the procedure report that was given to you for any specific questions about what was found during the examination.  If the procedure report does not answer your questions, please call your gastroenterologist to clarify.  If you requested that your care partner not be given the details of your procedure findings, then the procedure report has been included in a sealed envelope for you to review at your convenience later.  YOU SHOULD EXPECT: Some feelings of bloating in the abdomen. Passage of more gas than usual.  Walking can help get rid of the air that was put into your GI tract during the procedure and reduce the bloating. If you had a lower endoscopy (such as a colonoscopy or flexible sigmoidoscopy) you may notice spotting of blood in your stool or on the toilet paper. If you underwent a bowel prep for your procedure, you may not have a normal bowel movement for a few days.  Please Note:  You might notice some irritation and congestion in your nose or some drainage.  This is from the oxygen used during your procedure.  There is no need for concern and it should clear up in a day or so.  SYMPTOMS TO REPORT IMMEDIATELY:   Following lower endoscopy (colonoscopy or flexible sigmoidoscopy):  Excessive amounts of blood in the stool  Significant tenderness or worsening of abdominal pains  Swelling of the abdomen that is new, acute  Fever of 100F or higher   For urgent or emergent issues, a gastroenterologist can be reached at any hour by calling [phone]. Do not use MyChart messaging for urgent concerns.    DIET:  We do recommend a small meal at first, but then you may proceed to your regular diet.  Drink plenty of fluids but you should avoid alcoholic beverages for 24  hours.  ACTIVITY:  You should plan to take it easy for the rest of today and you should NOT DRIVE or use heavy machinery until tomorrow (because of the sedation medicines used during the test).    FOLLOW UP: Our staff will call the number listed on your records 48-72 hours following your procedure to check on you and address any questions or concerns that you may have regarding the information given to you following your procedure. If we do not reach you, we will leave a message.  We will attempt to reach you two times.  During this call, we will ask if you have developed any symptoms of COVID 19. If you develop any symptoms (ie: fever, flu-like symptoms, shortness of breath, cough etc.) before then, please call [phone].  If you test positive for Covid 19 in the 2 weeks post procedure, please call and report this information to [place].     SIGNATURES/CONFIDENTIALITY: You and/or your care partner have signed paperwork which will be entered into your electronic medical record.  These signatures attest to the fact that that the information above on your After Visit Summary has been reviewed and is understood.  Full responsibility of the confidentiality of this discharge information lies with you and/or your care-partner.

## 2020-04-01 NOTE — Op Note (Signed)
[place] Patient Name: Tamara Watkins Procedure Date: 04/01/2020 10:15 AM MRN: [id number] Endoscopist: Gerrit Heck , MD Age: 54 Referring MD:  Date of Birth: 02-02-1966 Gender: Female Account #: [id number] Procedure:                Flexible Sigmoidoscopy Indications:              Personal history of malignant neoplasm of the colon                           54 yo female with colonoscopy in 01/2019 notable for                            25 mm TVA with HGD and a 1 mm focus of                            Adenocarcinoma, resected entirely with clear margin                            at the cauterized edge. Repeat colonoscopy 09/2019                            with small 2 mm HP within tattoo area, and                            otherwise normal appearing post polypectomy scar                            distal to the tattoo. This was biopsied and benign.                            She presents today for repeat flexible                            sigmoidoscopy for short interval surveillance.                            Repeat MRI Pelvis scheduled for later this month.                            She is otherwise without active GI symptoms. Medicines:                Monitored Anesthesia Care Procedure:                Pre-Anesthesia Assessment:                           - Prior to the procedure, a History and Physical                            was performed, and patient medications and                            allergies were reviewed. The patient's tolerance of  previous anesthesia was also reviewed. The risks                            and benefits of the procedure and the sedation                            options and risks were discussed with the patient.                            All questions were answered, and informed consent                            was obtained. Prior Anticoagulants: The patient has                            taken no previous  anticoagulant or antiplatelet                            agents. ASA Grade Assessment: II - A patient with                            mild systemic disease. After reviewing the risks                            and benefits, the patient was deemed in                            satisfactory condition to undergo the procedure.                           After obtaining informed consent, the scope was                            passed under direct vision. The Colonoscope was                            introduced through the anus and advanced to the the                            left transverse colon. The flexible sigmoidoscopy                            was accomplished without difficulty. The patient                            tolerated the procedure well. The quality of the                            bowel preparation was excellent. Scope In: 10:28:54 AM Scope Out: 10:37:03 AM Total Procedure Duration: 0 hours 8 minutes 9 seconds  Findings:                 The perianal and digital rectal examinations were  normal.                           A tattoo was seen in the recto-sigmoid colon. There                            was a small, well healed scar within the tattoo                            field whic corresponds to the previous hyperplastic                            polyp that was resected at last colonoscopy. The                            tattoo site otherwise appeared normal.                           A post polypectomy scar was found in the                            recto-sigmoid colon. The scar tissue was healthy in                            appearance. This was previously biopsied.                           The rectum, sigmoid colon, descending colon and                            distal transverse colon otherwise appeared normal.                           No additional abnormalities were found on                            retroflexion. Complications:             No immediate complications. Estimated Blood Loss:     Estimated blood loss was minimal. Estimated blood                            loss: none. Impression:               - A tattoo was seen in the recto-sigmoid colon. The                            tattoo site appeared normal.                           - Post-polypectomy scar in the recto-sigmoid colon.                           - The rectum, sigmoid colon, descending colon and  distal transverse colon are normal.                           - No specimens collected. Recommendation:           - Discharge patient to home.                           - Resume previous diet today.                           - Continue present medications.                           - Perform a colonoscopy in 1 year for ongoing                            surveillance.                           - Return to GI clinic in 6 months, or sooner as                            needed.                           - Proceed with MRI Pelvis as scheduled this month.                           - Follow-up with Dr. Dema Severin in the [facility] as scheduled. Gerrit Heck, MD 04/01/2020 10:50:51 AM

## 2020-04-01 NOTE — Progress Notes (Signed)
A and O x3. Report to RN. Tolerated MAC anesthesia well.

## 2020-04-05 ENCOUNTER — Telehealth: Payer: Self-pay

## 2020-04-05 ENCOUNTER — Telehealth: Payer: Self-pay | Admitting: *Deleted

## 2020-04-05 NOTE — Telephone Encounter (Signed)
Left message on follow up call. 

## 2020-04-05 NOTE — Telephone Encounter (Signed)
Left message on f/u call 

## 2020-04-09 ENCOUNTER — Ambulatory Visit (HOSPITAL_COMMUNITY)
Admission: RE | Admit: 2020-04-09 | Discharge: 2020-04-09 | Disposition: A | Discharge status: Home / Self Care | Payer: 59 | Source: Ambulatory Visit | Attending: Gastroenterology | Admitting: Gastroenterology

## 2020-04-09 ENCOUNTER — Other Ambulatory Visit: Payer: Self-pay | Admitting: Gastroenterology

## 2020-04-09 ENCOUNTER — Other Ambulatory Visit: Payer: Self-pay

## 2020-04-09 DIAGNOSIS — C187 Malignant neoplasm of sigmoid colon: Secondary | ICD-10-CM | POA: Diagnosis not present

## 2020-06-16 ENCOUNTER — Other Ambulatory Visit: Payer: Self-pay | Admitting: Internal Medicine

## 2020-07-18 ENCOUNTER — Other Ambulatory Visit: Payer: Self-pay | Admitting: Internal Medicine

## 2020-08-27 ENCOUNTER — Encounter: Payer: Self-pay | Admitting: Gastroenterology

## 2020-09-08 ENCOUNTER — Other Ambulatory Visit: Payer: Self-pay | Admitting: Internal Medicine

## 2020-10-03 ENCOUNTER — Other Ambulatory Visit: Payer: Self-pay | Admitting: Internal Medicine

## 2020-10-06 ENCOUNTER — Telehealth: Payer: Self-pay | Admitting: *Deleted

## 2020-10-06 NOTE — Telephone Encounter (Signed)
Yes, certainly understand the confusion.  I had initially thought 1 year recall after the flexible sigmoidoscopy in 03/2020, but her Colorectal Surgeon subsequently asked for 1 more colonoscopy in 10/2020.  If that colonoscopy is clear/negative, we can then start to increase her surveillance intervals.  So okay to keep colonoscopy as scheduled in early December. Thanks

## 2020-10-06 NOTE — Telephone Encounter (Signed)
Dr.Cirigliano,  This patient is scheduled for a recall colon with you on 10/27/2020. Her last colonoscopy was 10/07/2019 with a 1 year recall. Then she had a flex sigmoid on 04/01/2020 with a recall colon in 1 year. Her last OV with you was on 10/2019. Patient has a hx of malignant neoplasm of colon. Is the patient due for a colonoscopy now or May 2022? Please advise. Thank you, Gerry Heaphy pv

## 2020-10-06 NOTE — Telephone Encounter (Signed)
Noted. Thanks.

## 2020-10-13 ENCOUNTER — Other Ambulatory Visit: Payer: Self-pay

## 2020-10-13 ENCOUNTER — Ambulatory Visit (AMBULATORY_SURGERY_CENTER): Payer: Self-pay

## 2020-10-13 VITALS — Ht 66.5 in | Wt 156.8 lb

## 2020-10-13 DIAGNOSIS — C189 Malignant neoplasm of colon, unspecified: Secondary | ICD-10-CM

## 2020-10-13 MED ORDER — NA SULFATE-K SULFATE-MG SULF 17.5-3.13-1.6 GM/177ML PO SOLN: 1.0000 | Freq: Once | ORAL | 0 refills | Status: AC

## 2020-10-13 NOTE — Progress Notes (Signed)
No egg or soy allergy known to patient  No issues with past sedation with any surgeries or procedures No intubation problems in the past  No FH of Malignant Hyperthermia No diet pills per patient No home 02 use per patient  No blood thinners per patient  Pt denies issues with constipation  No A fib or A flutter  COVID 19 guidelines implemented in [facility] today with Pt and RN  Coupon given to pt in [facility] today , Code to Pharmacy  COVID vaccines completed on 02/2020 per pt;  Due to the COVID-19 pandemic we are asking patients to follow these guidelines. Please only bring one care partner. Please be aware that your care partner may wait in the car in the parking lot or if they feel like they will be too hot to wait in the car, they may wait in the lobby on the 4th floor. All care partners are required to wear a mask the entire time (we do not have any that we can provide them), they need to practice social distancing, and we will do a Covid check for all patient's and care partners when you arrive. Also we will check their temperature and your temperature. If the care partner waits in their car they need to stay in the parking lot the entire time and we will call them on their cell phone when the patient is ready for discharge so they can bring the car to the front of the building. Also all patient's will need to wear a mask into building.  

## 2020-10-14 ENCOUNTER — Encounter: Payer: Self-pay | Admitting: Gastroenterology

## 2020-10-27 ENCOUNTER — Encounter: Payer: Self-pay | Admitting: Gastroenterology

## 2020-10-27 ENCOUNTER — Other Ambulatory Visit: Payer: Self-pay

## 2020-10-27 ENCOUNTER — Ambulatory Visit (AMBULATORY_SURGERY_CENTER): Payer: 59 | Admitting: Gastroenterology

## 2020-10-27 VITALS — BP 105/71 | HR 65 | Temp 97.7°F | Resp 12 | Ht 66.5 in | Wt 156.8 lb

## 2020-10-27 DIAGNOSIS — C189 Malignant neoplasm of colon, unspecified: Secondary | ICD-10-CM

## 2020-10-27 DIAGNOSIS — Z85038 Personal history of other malignant neoplasm of large intestine: Secondary | ICD-10-CM | POA: Diagnosis not present

## 2020-10-27 DIAGNOSIS — D124 Benign neoplasm of descending colon: Secondary | ICD-10-CM | POA: Diagnosis not present

## 2020-10-27 DIAGNOSIS — K640 First degree hemorrhoids: Secondary | ICD-10-CM

## 2020-10-27 DIAGNOSIS — D122 Benign neoplasm of ascending colon: Secondary | ICD-10-CM | POA: Diagnosis not present

## 2020-10-27 MED ORDER — SODIUM CHLORIDE 0.9 % IV SOLN: 500.0000 mL | INTRAVENOUS | Status: DC

## 2020-10-27 NOTE — Progress Notes (Signed)
Pt's states no medical or surgical changes since previsit or office visit. 

## 2020-10-27 NOTE — Patient Instructions (Addendum)
Handout on polyps & hemorrhoids  given to you today  Await pathology results on polyps removed    RETURN TO GI OFFICE IN 6 MONTHS - MAKE THIS APPOINTMENT   YOU HAD AN ENDOSCOPIC PROCEDURE TODAY AT [place]:   Refer to the procedure report that was given to you for any specific questions about what was found during the examination.  If the procedure report does not answer your questions, please call your gastroenterologist to clarify.  If you requested that your care partner not be given the details of your procedure findings, then the procedure report has been included in a sealed envelope for you to review at your convenience later.  YOU SHOULD EXPECT: Some feelings of bloating in the abdomen. Passage of more gas than usual.  Walking can help get rid of the air that was put into your GI tract during the procedure and reduce the bloating. If you had a lower endoscopy (such as a colonoscopy or flexible sigmoidoscopy) you may notice spotting of blood in your stool or on the toilet paper. If you underwent a bowel prep for your procedure, you may not have a normal bowel movement for a few days.  Please Note:  You might notice some irritation and congestion in your nose or some drainage.  This is from the oxygen used during your procedure.  There is no need for concern and it should clear up in a day or so.  SYMPTOMS TO REPORT IMMEDIATELY:   Following lower endoscopy (colonoscopy or flexible sigmoidoscopy):  Excessive amounts of blood in the stool  Significant tenderness or worsening of abdominal pains  Swelling of the abdomen that is new, acute  Fever of 100F or higher    For urgent or emergent issues, a gastroenterologist can be reached at any hour by calling [phone]. Do not use MyChart messaging for urgent concerns.    DIET:  We do recommend a small meal at first, but then you may proceed to your regular diet.  Drink plenty of fluids but you should avoid  alcoholic beverages for 24 hours.  ACTIVITY:  You should plan to take it easy for the rest of today and you should NOT DRIVE or use heavy machinery until tomorrow (because of the sedation medicines used during the test).    FOLLOW UP: Our staff will call the number listed on your records 48-72 hours following your procedure to check on you and address any questions or concerns that you may have regarding the information given to you following your procedure. If we do not reach you, we will leave a message.  We will attempt to reach you two times.  During this call, we will ask if you have developed any symptoms of COVID 19. If you develop any symptoms (ie: fever, flu-like symptoms, shortness of breath, cough etc.) before then, please call [phone].  If you test positive for Covid 19 in the 2 weeks post procedure, please call and report this information to [place].    If any biopsies were taken you will be contacted by phone or by letter within the next 1-3 weeks.  Please call us at [phone] if you have not heard about the biopsies in 3 weeks.    SIGNATURES/CONFIDENTIALITY: You and/or your care partner have signed paperwork which will be entered into your electronic medical record.  These signatures attest to the fact that that the information above on your After Visit Summary has been reviewed and is  understood.  Full responsibility of the confidentiality of this discharge information lies with you and/or your care-partner. 

## 2020-10-27 NOTE — Progress Notes (Signed)
Called to room to assist during endoscopic procedure.  Patient ID and intended procedure confirmed with present staff. Received instructions for my participation in the procedure from the performing physician.  

## 2020-10-27 NOTE — Progress Notes (Signed)
pt tolerated well. VSS. awake and to recovery. Report given to RN.  

## 2020-10-27 NOTE — Op Note (Signed)
[place] Patient Name: Tamara Watkins Procedure Date: 10/27/2020 2:13 PM MRN: [id number] Endoscopist: Gerrit Heck , MD Age: 54 Referring MD:  Date of Birth: [birth year] Gender: Female Account #: [id number] Procedure:                Colonoscopy Indications:              High risk colon cancer surveillance: Personal                            history of colon cancer                           54 yo female with the following history:                           - Index colonoscopy in 01/2019 notable for 25 mm TVA                            with HGD and a 1 mm focus of Adenocarcinoma,                            resected entirely with clear margin at the                            cauterized edge.                           - Repeat colonoscopy 09/2019 with small 2 mm HP                            within tattoo area, and otherwise normal appearing                            post polypectomy scar distal to the tattoo. This                            was biopsied and benign.                           - MRI pelvis 09/2019: Normal, no mass or                            lymphadenopathy                           - Short interval repeat flexible sigmoidoscopy in                            03/2020 with well healed scar in tattoo field c/w                            previous hyperplastic polyp resection. Healthy                            appearing post polypectomy scar in recto-sigmoid.                           -  MRI Pelvis 03/2020: Normal, no mass or                            lymphadenopathy                           She presents today for repeat short interval                            surveillance. She is otherwise without active GI                            symptoms. Medicines:                Monitored Anesthesia Care Procedure:                Pre-Anesthesia Assessment:                           - Prior to the procedure, a History and Physical                            was  performed, and patient medications and                            allergies were reviewed. The patient's tolerance of                            previous anesthesia was also reviewed. The risks                            and benefits of the procedure and the sedation                            options and risks were discussed with the patient.                            All questions were answered, and informed consent                            was obtained. Prior Anticoagulants: The patient has                            taken no previous anticoagulant or antiplatelet                            agents. ASA Grade Assessment: II - A patient with                            mild systemic disease. After reviewing the risks                            and benefits, the patient was deemed in  satisfactory condition to undergo the procedure.                           After obtaining informed consent, the colonoscope                            was passed under direct vision. Throughout the                            procedure, the patient's blood pressure, pulse, and                            oxygen saturations were monitored continuously. The                            Colonoscope was introduced through the anus and                            advanced to the the cecum, identified by                            appendiceal orifice and ileocecal valve. The                            colonoscopy was performed without difficulty. The                            patient tolerated the procedure well. The quality                            of the bowel preparation was good. The ileocecal                            valve, appendiceal orifice, and rectum were                            photographed. Scope In: 2:21:01 PM Scope Out: 2:38:46 PM Scope Withdrawal Time: 0 hours 13 minutes 14 seconds  Total Procedure Duration: 0 hours 17 minutes 45 seconds  Findings:                 The  perianal and digital rectal examinations were                            normal.                           Three sessile polyps were found in the descending                            colon and ascending colon. The polyps were 2 to 4                            mm in size. These polyps were removed with a cold  snare. Resection and retrieval were complete.                            Estimated blood loss was minimal.                           A tattoo was seen in the recto-sigmoid colon. The                            tattoo site appeared normal. No residual polypoid                            tissue.                           Non-bleeding internal hemorrhoids were found during                            retroflexion. The hemorrhoids were small. Complications:            No immediate complications. Estimated Blood Loss:     Estimated blood loss was minimal. Impression:               - Three 2 to 4 mm polyps in the descending colon                            and in the ascending colon, removed with a cold                            snare. Resected and retrieved.                           - A tattoo was seen in the recto-sigmoid colon. The                            tattoo site appeared normal.                           - Non-bleeding internal hemorrhoids. Recommendation:           - Patient has a contact number available for                            emergencies. The signs and symptoms of potential                            delayed complications were discussed with the                            patient. Return to normal activities tomorrow.                            Written discharge instructions were provided to the                            patient.                           -  Resume previous diet.                           - Continue present medications.                           - Await pathology results.                           - Repeat colonoscopy for  surveillance based on                            pathology results.                           - Return to GI office in 6 months. Gerrit Heck, MD 10/27/2020 2:53:33 PM

## 2020-10-29 ENCOUNTER — Telehealth: Payer: Self-pay

## 2020-10-29 NOTE — Telephone Encounter (Signed)
  Follow up Call-  Call back number 10/27/2020 04/01/2020 02/21/2019  Post procedure Call Back phone  # [phone]  Permission to leave phone message Yes Yes Yes  Some recent data might be hidden     Patient questions:  Do you have a fever, pain , or abdominal swelling? No. Pain Score  0 *  Have you tolerated food without any problems? Yes.    Have you been able to return to your normal activities? Yes.    Do you have any questions about your discharge instructions: Diet   No. Medications  No. Follow up visit  No.  Do you have questions or concerns about your Care? No.  Actions: * If pain score is 4 or above: No action needed, pain <4.  1. Have you developed a fever since your procedure? no  2.   Have you had an respiratory symptoms (SOB or cough) since your procedure? no  3.   Have you tested positive for COVID 19 since your procedure no  4.   Have you had any family members/close contacts diagnosed with the COVID 19 since your procedure?  no   If yes to any of these questions please route to Joylene John, RN and Joella Prince, RN

## 2020-11-04 ENCOUNTER — Encounter: Payer: Self-pay | Admitting: Gastroenterology

## 2020-11-06 ENCOUNTER — Other Ambulatory Visit: Payer: Self-pay | Admitting: Internal Medicine

## 2020-11-09 ENCOUNTER — Telehealth: Payer: Self-pay | Admitting: General Surgery

## 2020-11-09 NOTE — Telephone Encounter (Signed)
Contacted the patient and discussed the results to her colonoscopy. She will have a sigmoidoscopy in 1 year, and if all is well she will go 3 years for a colonoscopy.

## 2020-11-09 NOTE — Telephone Encounter (Signed)
-----   Message from [place], DO sent at 11/08/2020 12:45 PM EST ----- 2 of the polyps removed at the time of colonoscopy were Tubular Adenomas.  These are considered benign, but precancerous.  These were removed entirely at the time of colonoscopy.  I discussed her care with Dr. Dema Severin from Colorectal Surgery, and we both agree that a repeat flexible sigmoidoscopy in 12 months would be appropriate to again survey the prior polypectomy site.  If that sigmoidoscopy is unremarkable, can plan for repeat colonoscopy 3 years later.

## 2021-01-27 ENCOUNTER — Telehealth: Payer: Self-pay | Admitting: General Surgery

## 2021-01-27 DIAGNOSIS — K7689 Other specified diseases of liver: Secondary | ICD-10-CM

## 2021-01-27 NOTE — Telephone Encounter (Signed)
-----   Message from [place], DO sent at 01/22/2021  3:03 PM EST ----- Can you please call this patient to set up MRI liver to evaluate the probable cysts that were seen on the prior CT? Thanks.  ----- Message ----- From: Ileana Roup, MD Sent: 01/21/2021   3:37 PM EST To: [place] saw her back, more as to make sure we didn't miss anything. I told her she can continue to follow with you long-term here. She had some liver lesions presumed cysts seen and you had mentioned in your note maybe getting a liver MRI at 12 months. She asked if I would ask you :). Let me know if you need anything from my end. Have a nice weekend!  Gerald Stabs

## 2021-01-27 NOTE — Telephone Encounter (Signed)
Left a detailed message on the patients voicemail that I was putting in orders for an MRI and she would hear back from the schedulers at [facility] to schedule this appointment. Advised if she has any questions to please contact me.

## 2021-02-05 ENCOUNTER — Ambulatory Visit (HOSPITAL_COMMUNITY)
Admission: RE | Admit: 2021-02-05 | Discharge: 2021-02-05 | Disposition: A | Discharge status: Home / Self Care | Payer: 59 | Source: Ambulatory Visit | Attending: Gastroenterology | Admitting: Gastroenterology

## 2021-02-05 ENCOUNTER — Other Ambulatory Visit: Payer: Self-pay

## 2021-02-05 DIAGNOSIS — K7689 Other specified diseases of liver: Secondary | ICD-10-CM | POA: Diagnosis present

## 2021-02-05 MED ORDER — GADOBUTROL 1 MMOL/ML IV SOLN
7.0000 mL | Freq: Once | INTRAVENOUS | Status: AC | PRN
  Administered 2021-02-05: 7 mL via INTRAVENOUS

## 2021-08-29 ENCOUNTER — Telehealth: Payer: Self-pay | Admitting: General Surgery

## 2021-08-29 DIAGNOSIS — Z86010 Personal history of colonic polyps: Secondary | ICD-10-CM

## 2021-08-29 DIAGNOSIS — D128 Benign neoplasm of rectum: Secondary | ICD-10-CM

## 2021-08-29 MED ORDER — CLENPIQ 10-3.5-12 MG-GM -GM/160ML PO SOLN
1.0000 | ORAL | 0 refills | Status: DC
Start: 2021-08-29 — End: 2021-11-10

## 2021-08-29 NOTE — Telephone Encounter (Signed)
Correct, she is due for repeat in December, and if that is unremarkable, then we can change to 3 year interval. Thanks.

## 2021-08-29 NOTE — Telephone Encounter (Signed)
Spoke with the patient and told her that December 2022 is when she needs to have her colonoscopy as she suspected. Informed her I would send her prep to the pharmacy and instructions to her mychart. Patient verbalized understanding

## 2021-08-29 NOTE — Telephone Encounter (Signed)
The patient contacted the office and spoke with Yvetta Coder regarding her 1 year colonoscopy. Yvetta Coder was uncertain if patient was to be scheduled.   After researching I see where she was sent a letter to have her colonoscopy every 5 years. I contacted Lirio and we discussed her pathology and she stated she really should have one in 12/22.( Once a year) I placed her on the schedule and advised I would discuss this with Dr Bryan Lemma and if this needs to be cancelled we will advise her either way. Patient verbalized understanding and was grateful.

## 2021-10-27 ENCOUNTER — Encounter: Payer: Self-pay | Admitting: Gastroenterology

## 2021-11-09 ENCOUNTER — Encounter: Payer: Self-pay | Admitting: Certified Registered Nurse Anesthetist

## 2021-11-10 ENCOUNTER — Encounter: Payer: Self-pay | Admitting: Gastroenterology

## 2021-11-10 ENCOUNTER — Ambulatory Visit (AMBULATORY_SURGERY_CENTER): Payer: 59 | Admitting: Gastroenterology

## 2021-11-10 VITALS — BP 111/78 | HR 78 | Temp 98.0°F | Resp 20 | Ht 66.0 in | Wt 163.0 lb

## 2021-11-10 DIAGNOSIS — C187 Malignant neoplasm of sigmoid colon: Secondary | ICD-10-CM

## 2021-11-10 DIAGNOSIS — Z85038 Personal history of other malignant neoplasm of large intestine: Secondary | ICD-10-CM | POA: Diagnosis present

## 2021-11-10 DIAGNOSIS — K641 Second degree hemorrhoids: Secondary | ICD-10-CM

## 2021-11-10 DIAGNOSIS — Z86010 Personal history of colonic polyps: Secondary | ICD-10-CM | POA: Diagnosis not present

## 2021-11-10 MED ORDER — SODIUM CHLORIDE 0.9 % IV SOLN: 500.0000 mL | Freq: Once | INTRAVENOUS | Status: DC

## 2021-11-10 NOTE — Progress Notes (Signed)
GASTROENTEROLOGY PROCEDURE H&P NOTE   Primary Care Physician: Burnis Medin, MD    Reason for Procedure:  Colon cancer surveillance, polyp surveillance  Plan:    Colonoscopy  Patient is appropriate for endoscopic procedure(s) in the ambulatory ([place]) setting.  The nature of the procedure, as well as the risks, benefits, and alternatives were carefully and thoroughly reviewed with the patient. Ample time for discussion and questions allowed. The patient understood, was satisfied, and agreed to proceed.     HPI: Tamara Watkins is a 55 y.o. female who presents for colonoscopy for ongoing surveillance.  She is otherwise without active GI symptoms today.  -Colonoscopy (01/2019): Biopsies were normal/negative for Microscopic Colitis.  2 small right-sided tubular adenomas resected.  A larger 25 mm polyp in the rectosigmoid colon resected.  Pathology demonstrated tubulovillous adenoma with high-grade dysplasia and a small 1 mm focus of adenocarcinoma.  While the adenocarcinoma invaded the superficial submucosa, this was located 3 mm from the cauterized margin - Repeat colonoscopy in 09/2019: 2 mm hyperplastic polyp in the sigmoid, tattoo in sigmoid with healthy appearing scar distal to the tattoo.  This was extensively biopsied and negative for adenoma, dysplasia, malignancy -CT chest/abdomen/pelvis (09/2019): 1.2 x 0.9 x 0.9 fluid-filled hepatic cyst, 3 mm hepatic cyst, 4 mm hepatic cyst.  Otherwise normal-appearing liver.  No concerning lesions, e/o metastasis, no lymphadenopathy -MRI pelvis (09/2019): Normal, without lymphadenopathy or residual polyp/lesion -Referral to Dr. Nadeen Landau at [facility] (10/28/2019): No plan for surgical intervention.  Recommend close surveillance with flexible sigmoidoscopy in 3-6 months, repeat MRI pelvis in 6 months, follow-up in [place] [facility] in 3 months - Flexible sigmoidoscopy (03/2020): well healed scar in tattoo field c/w previous hyperplastic  polyp resection. Healthy appearing post polypectomy scar in rectosigmoid. - MRI Pelvis 03/2020: Normal, no mass or lymphadenopathy - Colonoscopy (10/2020): 2 subcentimeter tubular adenomas, tattooing rectosigmoid without residual polypoid tissue, internal hemorrhoids - 01/2021: Follow-up with Dr. Dema Severin with recommendation for repeat colonoscopy in 10/1999 39-month-old, can start to liberalize surveillance intervals - 01/2021: MRI liver: Small benign liver cyst.  No suspicion for metastasis.  Mild hepatic steatosis.  Past Medical History:  Diagnosis Date   Agatston coronary artery calcium score less than 100 2013   Allergy    seasonal allergies   Anxiety    hx of   Cancer ([place]) 2020   colon CA   Environmental allergies    GERD (gastroesophageal reflux disease)    hx of   Gynecomastia 03/01/2012   Headache(784.0)    History of syncope    with low bp orthostatic onset hs   Hives    chronic   Hx of colonic polyps    5 year recall history of rectal bleeding   Hx of skin cancer, basal cell    Hyperlipidemia    Internal hemorrhoids    Migraine    Urinary incontinence    related to childbirth urge and stress [name] helps    Past Surgical History:  Procedure Laterality Date   BIOPSY  10/07/2019   Procedure: BIOPSY;  Surgeon: Lavena Bullion, DO;  Location: [facility];  Service: Gastroenterology;;   CESAREAN SECTION     x 1   COLONOSCOPY     COLONOSCOPY WITH PROPOFOL N/A 10/07/2019   Procedure: COLONOSCOPY WITH PROPOFOL;  Surgeon: Lavena Bullion, DO;  Location: [facility];  Service: Gastroenterology;  Laterality: N/A;   DESTRUCTION OF LESION ANAL     DILATION AND CURETTAGE OF UTERUS  for miscarrage   MOHS SURGERY     neck   POLYPECTOMY     REDUCTION MAMMAPLASTY  2013   WISDOM TOOTH EXTRACTION      Prior to Admission medications   Medication Sig Start Date End Date Taking? Authorizing Provider  escitalopram (LEXAPRO) 10 MG tablet TAKE 1 TABLET BY MOUTH EVERY DAY  **PLEASE SCHEDULE VISIT W/LABS FOR REFILLS** 11/08/20  Yes Panosh, Standley Brooking, MD  ibuprofen (ADVIL) 200 MG tablet Take 400 mg by mouth every 8 (eight) hours as needed for headache.   Yes [provider]  levocetirizine (XYZAL) 5 MG tablet Take 5 mg by mouth every evening.   Yes [provider]  Arvid Right 150 MG/ML prefilled syringe Inject into the skin. Every 4 weeks 10/24/21   [provider]    Current Outpatient Medications  Medication Sig Dispense Refill   escitalopram (LEXAPRO) 10 MG tablet TAKE 1 TABLET BY MOUTH EVERY DAY **PLEASE SCHEDULE VISIT W/LABS FOR REFILLS** 30 tablet 0   ibuprofen (ADVIL) 200 MG tablet Take 400 mg by mouth every 8 (eight) hours as needed for headache.     levocetirizine (XYZAL) 5 MG tablet Take 5 mg by mouth every evening.     XOLAIR 150 MG/ML prefilled syringe Inject into the skin. Every 4 weeks     Current Facility-Administered Medications  Medication Dose Route Frequency Provider Last Rate Last Admin   0.9 %  sodium chloride infusion  500 mL Intravenous Once Priti Consoli V, DO        Allergies as of 11/10/2021 - Review Complete 11/10/2021  Allergen Reaction Noted   Codeine Other (See Comments)    Levofloxacin  02/10/2011    Family History  Problem Relation Age of Onset   Hyperlipidemia Mother    Breast cancer Mother    Lung cancer Mother    Colon polyps Mother    Depression Father    Hypertension Father    Other Father        tranverse mylitis   Colon polyps Father    Suicidality Father    Depression Brother    Hypertension Brother    Depression Daughter    Anxiety disorder Daughter    Allergies Other        Children   Colon polyps Paternal Grandmother    Colon cancer Neg Hx        ? great uncles   Rectal cancer Neg Hx    Stomach cancer Neg Hx    Esophageal cancer Neg Hx     Social History   Socioeconomic History   Marital status: Married    Spouse name: Not on file   Number of children: 2   Years of  education: Not on file   Highest education level: Not on file  Occupational History   Occupation: Statistician  Tobacco Use   Smoking status: Never   Smokeless tobacco: Never  Vaping Use   Vaping Use: Never used  Substance and Sexual Activity   Alcohol use: Yes    Alcohol/week: 14.0 standard drinks    Types: 14 Glasses of wine per week   Drug use: No   Sexual activity: Not on file  Other Topics Concern   Not on file  Social History Narrative   Occupation: Production manager- Building surveyor, Masters level education, Education officer, museum 4 days per week   Married   Regular exercise- yes   HH of 4   No pets   Recently moved from [place]  1 glass wine per night   Social Determinants of Health   Financial Resource Strain: Not on file  Food Insecurity: Not on file  Transportation Needs: Not on file  Physical Activity: Not on file  Stress: Not on file  Social Connections: Not on file  Intimate Partner Violence: Not on file    Physical Exam: Vital signs in last 24 hours: @BP  123/74    Pulse 86    Temp 98 F (36.7 C)    Ht 5\' 6"  (1.676 m)    Wt 163 lb (73.9 kg)    LMP  (Approximate) Comment: mid-Novembe   SpO2 98%    BMI 26.31 kg/m  GEN: NAD EYE: Sclerae anicteric ENT: MMM CV: Non-tachycardic Pulm: CTA b/l GI: Soft, NT/ND NEURO:  Alert & Oriented x 3   Gerrit Heck, DO [facility]   11/10/2021 1:02 PM

## 2021-11-10 NOTE — Op Note (Signed)
[facility] Patient Name: Tamara Watkins Procedure Date: 11/10/2021 1:34 PM MRN: [id number] Endoscopist: Gerrit Heck , MD Age: 55 Referring MD:  Date of Birth: 08-Nov-1966 Gender: Female Account #: [id number] Procedure:                Colonoscopy Indications:              High risk colon cancer surveillance: Personal                            history of colonic polyps, High risk colon cancer                            surveillance: Personal history of colon cancer                           -Colonoscopy (01/2019): Biopsies were                            normal/negative for Microscopic Colitis. 2 small                            right-sided tubular adenomas resected. A larger 25                            mm polyp in the rectosigmoid colon resected.                            Pathology demonstrated tubulovillous adenoma with                            high-grade dysplasia and a small 1 mm focus of                            adenocarcinoma. While the adenocarcinoma invaded                            the superficial submucosa, this was located 3 mm                            from the cauterized margin                           -Colonoscopy in 09/2019: 2 mm hyperplastic polyp in                            the sigmoid, tattoo in sigmoid with healthy                            appearing scar distal to the tattoo. This was                            extensively biopsied and negative for adenoma,                            dysplasia, malignancy                           -  CT chest/abdomen/pelvis (09/2019): 1.2 x 0.9 x 0.9                            fluid-filled hepatic cyst, 3 mm hepatic cyst, 4 mm                            hepatic cyst. Otherwise normal-appearing liver. No                            concerning lesions, e/o metastasis, no                            lymphadenopathy                           -MRI pelvis (09/2019): Normal, without                             lymphadenopathy or residual polyp/lesion                           - Flexible sigmoidoscopy (03/2020): well healed scar                            in tattoo field                           c/w previous hyperplastic polyp resection. Healthy                            appearing post polypectomy scar in rectosigmoid.                           - MRI Pelvis 03/2020: Normal, no mass or                            lymphadenopathy                           - Colonoscopy (10/2020): 2 subcentimeter tubular                            adenomas, tattooing rectosigmoid without residual                            polypoid tissue, internal hemorrhoids. Repeat in 1                            year Medicines:                Monitored Anesthesia Care Procedure:                Pre-Anesthesia Assessment:                           - Prior to the procedure, a History and Physical  was performed, and patient medications and                            allergies were reviewed. The patient's tolerance of                            previous anesthesia was also reviewed. The risks                            and benefits of the procedure and the sedation                            options and risks were discussed with the patient.                            All questions were answered, and informed consent                            was obtained. Prior Anticoagulants: The patient has                            taken no previous anticoagulant or antiplatelet                            agents. ASA Grade Assessment: II - A patient with                            mild systemic disease. After reviewing the risks                            and benefits, the patient was deemed in                            satisfactory condition to undergo the procedure.                           After obtaining informed consent, the colonoscope                            was passed under direct vision. Throughout the                             procedure, the patient's blood pressure, pulse, and                            oxygen saturations were monitored continuously. The                            CF [id number] #[id number] was introduced through the anus                            and advanced to the the terminal ileum. The  colonoscopy was performed without difficulty. The                            patient tolerated the procedure well. The quality                            of the bowel preparation was good. The terminal                            ileum, ileocecal valve, appendiceal orifice, and                            rectum were photographed. Scope In: 1:38:22 PM Scope Out: 1:52:23 PM Scope Withdrawal Time: 0 hours 9 minutes 38 seconds  Total Procedure Duration: 0 hours 14 minutes 1 second  Findings:                 The perianal and digital rectal examinations were                            normal.                           A tattoo was seen in the distal sigmoid colon. The                            tattoo site appeared normal. No residual polyp.                           The exam was otherwise normal throughout the                            remainder of the colon.                           Non-bleeding internal hemorrhoids were found during                            retroflexion. The hemorrhoids were small.                           The terminal ileum appeared normal. Complications:            No immediate complications. Estimated Blood Loss:     Estimated blood loss was minimal. Impression:               - A tattoo was seen in the distal sigmoid colon.                            The tattoo site appeared normal.                           - Non-bleeding internal hemorrhoids.                           - The examined portion of the ileum was normal.                           -  No specimens collected. Recommendation:           - Patient has a contact number available for                             emergencies. The signs and symptoms of potential                            delayed complications were discussed with the                            patient. Return to normal activities tomorrow.                            Written discharge instructions were provided to the                            patient.                           - Resume previous diet.                           - Continue present medications.                           - Repeat colonoscopy in 3 years for surveillance. Gerrit Heck, MD 11/10/2021 2:02:57 PM

## 2021-11-10 NOTE — Progress Notes (Signed)
VS- [facility] [place]  Cell phone off per pt

## 2021-11-10 NOTE — Patient Instructions (Signed)
YOU HAD AN ENDOSCOPIC PROCEDURE TODAY AT [facility]:   Refer to the procedure report that was given to you for any specific questions about what was found during the examination.  If the procedure report does not answer your questions, please call your gastroenterologist to clarify.  If you requested that your care partner not be given the details of your procedure findings, then the procedure report has been included in a sealed envelope for you to review at your convenience later.  **Handout given on hemorrhoids**  YOU SHOULD EXPECT: Some feelings of bloating in the abdomen. Passage of more gas than usual.  Walking can help get rid of the air that was put into your GI tract during the procedure and reduce the bloating. If you had a lower endoscopy (such as a colonoscopy or flexible sigmoidoscopy) you may notice spotting of blood in your stool or on the toilet paper. If you underwent a bowel prep for your procedure, you may not have a normal bowel movement for a few days.  Please Note:  You might notice some irritation and congestion in your nose or some drainage.  This is from the oxygen used during your procedure.  There is no need for concern and it should clear up in a day or so.  SYMPTOMS TO REPORT IMMEDIATELY:  Following lower endoscopy (colonoscopy or flexible sigmoidoscopy):  Excessive amounts of blood in the stool  Significant tenderness or worsening of abdominal pains  Swelling of the abdomen that is new, acute  Fever of 100F or higher  For urgent or emergent issues, a gastroenterologist can be reached at any hour by calling [phone]. Do not use MyChart messaging for urgent concerns.    DIET:  We do recommend a small meal at first, but then you may proceed to your regular diet.  Drink plenty of fluids but you should avoid alcoholic beverages for 24 hours.  ACTIVITY:  You should plan to take it easy for the rest of today and you should NOT DRIVE or use heavy  machinery until tomorrow (because of the sedation medicines used during the test).    FOLLOW UP: Our staff will call the number listed on your records 48-72 hours following your procedure to check on you and address any questions or concerns that you may have regarding the information given to you following your procedure. If we do not reach you, we will leave a message.  We will attempt to reach you two times.  During this call, we will ask if you have developed any symptoms of COVID 19. If you develop any symptoms (ie: fever, flu-like symptoms, shortness of breath, cough etc.) before then, please call [phone].  If you test positive for Covid 19 in the 2 weeks post procedure, please call and report this information to [place].    If any biopsies were taken you will be contacted by phone or by letter within the next 1-3 weeks.  Please call us at [phone] if you have not heard about the biopsies in 3 weeks.    SIGNATURES/CONFIDENTIALITY: You and/or your care partner have signed paperwork which will be entered into your electronic medical record.  These signatures attest to the fact that that the information above on your After Visit Summary has been reviewed and is understood.  Full responsibility of the confidentiality of this discharge information lies with you and/or your care-partner.

## 2021-11-10 NOTE — Progress Notes (Signed)
Report given to PACU, vss 

## 2021-11-14 ENCOUNTER — Telehealth: Payer: Self-pay | Admitting: *Deleted

## 2021-11-14 NOTE — Telephone Encounter (Signed)
°  Follow up Call-  Call back number 11/10/2021 10/27/2020 04/01/2020 02/21/2019  Post procedure Call Back phone  # [phone]  Permission to leave phone message Yes Yes Yes Yes  Some recent data might be hidden     Patient questions:  Do you have a fever, pain , or abdominal swelling? No. Pain Score  0 *  Have you tolerated food without any problems? Yes.    Have you been able to return to your normal activities? Yes.    Do you have any questions about your discharge instructions: Diet   No. Medications  No. Follow up visit  No.  Do you have questions or concerns about your Care? No.  Actions: * If pain score is 4 or above: No action needed, pain <4.

## 2022-02-03 ENCOUNTER — Telehealth: Payer: Self-pay

## 2022-02-17 NOTE — Telephone Encounter (Signed)
Opened in error

## 2022-02-20 ENCOUNTER — Encounter: Payer: Self-pay | Admitting: Gastroenterology

## 2023-02-15 ENCOUNTER — Ambulatory Visit: Payer: BC Managed Care – PPO | Admitting: Gastroenterology

## 2023-02-15 ENCOUNTER — Encounter: Payer: Self-pay | Admitting: Gastroenterology

## 2023-02-15 VITALS — BP 98/72 | HR 74 | Ht 66.0 in | Wt 168.0 lb

## 2023-02-15 DIAGNOSIS — Z85038 Personal history of other malignant neoplasm of large intestine: Secondary | ICD-10-CM

## 2023-02-15 NOTE — Progress Notes (Signed)
Chief Complaint:    History of colon cancer  GI History: 57 year old female with colonoscopy in 01/2019 performed for diarrhea.  Biopsies were normal/negative for Microscopic Colitis.  2 small right-sided tubular adenomas resected.  A larger 25 mm polyp in the rectosigmoid colon resected.  Pathology demonstrated tubulovillous adenoma with high-grade dysplasia and a small 1 mm focus of adenocarcinoma.  While the adenocarcinoma invaded the superficial submucosa, this was located 3 mm from the cauterized margin, demonstrating clear margins.    This was further evaluated as below:   -Repeat colonoscopy in 09/2019: 2 mm hyperplastic polyp in the sigmoid, tattoo in sigmoid with healthy appearing scar distal to the tattoo.  This was extensively biopsied and negative for adenoma, dysplasia, malignancy -CT chest/abdomen/pelvis (09/2019): 1.2 x 0.9 x 0.9 fluid-filled hepatic cyst, 3 mm hepatic cyst, 4 mm hepatic cyst.  Otherwise normal-appearing liver.  No concerning lesions, e/o metastasis, no lymphadenopathy -MRI pelvis (09/2019): Normal, without lymphadenopathy or residual polyp/lesion -Referral to Dr. Nadeen Landau at [facility] (10/28/2019): No plan for surgical intervention.  Recommend close surveillance with flexible sigmoidoscopy in 3-6 months, repeat MRI pelvis in 6 months, follow-up in [place] clinic in 3 months - Flexible sigmoidoscopy (03/2020): well healed scar in tattoo field c/w previous hyperplastic polyp resection. Healthy appearing post polypectomy scar in rectosigmoid.  - MRI Pelvis 03/2020: Normal, no mass or lymphadenopathy  - Colonoscopy (10/2020): 2 subcentimeter tubular adenomas, tattooing rectosigmoid without residual polypoid tissue, internal hemorrhoids. Repeat in 1 year - MRI liver (01/2021): Benign liver cyst measuring 1.3 x 1.0 cm, stable from previous CT.  No further imaging needed. - Colonoscopy (10/2021): Tattoo in sigmoid colon with normal surrounding mucosa.  Otherwise  normal colon.  Small internal hemorrhoids.  Normal TI.  Repeat in 3 years.   HPI:     Patient is a 57 y.o. female presenting to the Gastroenterology Clinic for routine follow-up.  She is otherwise without any symptoms or active issues today.  Feeling in her usual state of health.  Normal bowel habits, good p.o. intake, no abdominal pain.   Review of systems:     No chest pain, no SOB, no fevers, no urinary sx   Past Medical History:  Diagnosis Date   Agatston coronary artery calcium score less than 100 2013   Allergy    seasonal allergies   Anxiety    hx of   Cancer ([place]) 2020   colon CA   Environmental allergies    GERD (gastroesophageal reflux disease)    hx of   Gynecomastia 03/01/2012   Headache(784.0)    History of syncope    with low bp orthostatic onset hs   Hives    chronic   Hx of colonic polyps    5 year recall history of rectal bleeding   Hx of skin cancer, basal cell    Hyperlipidemia    Internal hemorrhoids    Migraine    Urinary incontinence    related to childbirth urge and stress [name] helps    Patient's surgical history, family medical history, social history, medications and allergies were all reviewed in Epic    Current Outpatient Medications  Medication Sig Dispense Refill   desloratadine (CLARINEX) 5 MG tablet Take 5 mg by mouth daily.     escitalopram (LEXAPRO) 10 MG tablet TAKE 1 TABLET BY MOUTH EVERY DAY **PLEASE SCHEDULE VISIT W/LABS FOR REFILLS** 30 tablet 0   fexofenadine (ALLEGRA) 60 MG tablet Take 60 mg by mouth daily as needed  for allergies or rhinitis.     ibuprofen (ADVIL) 200 MG tablet Take 400 mg by mouth every 8 (eight) hours as needed for headache.     No current facility-administered medications for this visit.    Physical Exam:     BP 98/72   Pulse 74   Ht 5\' 6"  (1.676 m)   Wt 168 lb (76.2 kg)   LMP 02/15/2023   SpO2 97%   BMI 27.12 kg/m   GENERAL:  Pleasant female in NAD PSYCH: : Cooperative, normal affect NEURO:  Alert and oriented x 3, no focal neurologic deficits   IMPRESSION and PLAN:    1) History of colon cancer Index colonoscopy in 01/2019 with a 25 mm polyp in the rectosigmoid that was resected endoscopically, with pathology demonstrating TVA with HGD and a small 1 mm focus of adenocarcinoma.  This was completely resected via colonoscopy.  Has since had multiple MRIs and follow-up flexible sigmoidoscopy and colonoscopy demonstrating no evidence of recurrence, including most recent colonoscopy in 10/2021.  By guidelines, extended out to 3-year surveillance interval. - Repeat colonoscopy in 10/2024 for ongoing surveillance - Can call or follow-up in the office prn            Lavena Bullion ,DO, FACG 02/15/2023, 11:40 AM

## 2023-02-15 NOTE — Patient Instructions (Addendum)
  Follow up as needed. _______________________________________________________  If your blood pressure at your visit was 140/90 or greater, please contact your primary care physician to follow up on this.  _______________________________________________________  If you are age 55 or older, your body mass index should be between 23-30. Your Body mass index is 27.12 kg/m. If this is out of the aforementioned range listed, please consider follow up with your Primary Care Provider.  If you are age 32 or younger, your body mass index should be between 19-25. Your Body mass index is 27.12 kg/m. If this is out of the aformentioned range listed, please consider follow up with your Primary Care Provider.   ________________________________________________________  The  GI providers would like to encourage you to use [facility] to communicate with providers for non-urgent requests or questions.  Due to long hold times on the telephone, sending your provider a message by [facility] may be a faster and more efficient way to get a response.  Please allow 48 business hours for a response.  Please remember that this is for non-urgent requests.  _______________________________________________________ It was a pleasure to see you today!  Thank you for trusting me with your gastrointestinal care!

## 2024-02-20 ENCOUNTER — Telehealth: Payer: Self-pay | Admitting: Internal Medicine

## 2024-02-20 NOTE — Telephone Encounter (Signed)
 Copied from CRM [id number]. Topic: Appointments - Scheduling Inquiry for Clinic >> Feb 20, 2024  2:53 PM Martinique E wrote: Reason for CRM: Patient was wanting to get established with Dr. Fabian Sharp (Patient was previously seeing her in the past, but it has been 2+ years), agent told patient that she is currently not accepting new patients, but patient was questioning if she was still able to see her. Callback number for patient is [phone] to discuss.

## 2024-02-20 NOTE — Telephone Encounter (Signed)
**Note De-identified  [place] Obfuscation** Please advise 

## 2024-02-20 NOTE — Telephone Encounter (Signed)
 Ok to  give her appt  with me

## 2024-02-21 NOTE — Telephone Encounter (Signed)
 Lmom for to callback to sch an appt with dr Fabian Sharp

## 2024-02-26 NOTE — Telephone Encounter (Signed)
 Lmom for pt to call back to sch

## 2024-03-03 NOTE — Telephone Encounter (Signed)
 Lmom for pt to sch new pt appt

## 2024-09-22 ENCOUNTER — Encounter: Payer: Self-pay | Admitting: Gastroenterology

## 2024-11-10 ENCOUNTER — Ambulatory Visit

## 2024-11-10 VITALS — Ht 66.0 in | Wt 149.8 lb

## 2024-11-10 DIAGNOSIS — Z85038 Personal history of other malignant neoplasm of large intestine: Secondary | ICD-10-CM

## 2024-11-10 MED ORDER — NA SULFATE-K SULFATE-MG SULF 17.5-3.13-1.6 GM/177ML PO SOLN: 1.0000 | Freq: Once | ORAL | 0 refills | Status: AC

## 2024-11-10 NOTE — Progress Notes (Signed)

## 2024-11-14 ENCOUNTER — Encounter: Payer: Self-pay | Admitting: Gastroenterology

## 2024-11-24 ENCOUNTER — Encounter: Payer: Self-pay | Admitting: Gastroenterology

## 2024-11-24 ENCOUNTER — Ambulatory Visit: Admitting: Gastroenterology

## 2024-11-24 VITALS — BP 92/54 | HR 61 | Temp 97.2°F | Resp 10 | Ht 66.0 in | Wt 149.8 lb

## 2024-11-24 DIAGNOSIS — D128 Benign neoplasm of rectum: Secondary | ICD-10-CM | POA: Diagnosis not present

## 2024-11-24 DIAGNOSIS — Z85038 Personal history of other malignant neoplasm of large intestine: Secondary | ICD-10-CM

## 2024-11-24 DIAGNOSIS — D127 Benign neoplasm of rectosigmoid junction: Secondary | ICD-10-CM | POA: Diagnosis not present

## 2024-11-24 DIAGNOSIS — D125 Benign neoplasm of sigmoid colon: Secondary | ICD-10-CM | POA: Diagnosis not present

## 2024-11-24 DIAGNOSIS — Z860101 Personal history of adenomatous and serrated colon polyps: Secondary | ICD-10-CM

## 2024-11-24 DIAGNOSIS — K635 Polyp of colon: Secondary | ICD-10-CM | POA: Diagnosis not present

## 2024-11-24 DIAGNOSIS — D129 Benign neoplasm of anus and anal canal: Secondary | ICD-10-CM

## 2024-11-24 DIAGNOSIS — Z1211 Encounter for screening for malignant neoplasm of colon: Secondary | ICD-10-CM

## 2024-11-24 DIAGNOSIS — K640 First degree hemorrhoids: Secondary | ICD-10-CM

## 2024-11-24 DIAGNOSIS — D120 Benign neoplasm of cecum: Secondary | ICD-10-CM

## 2024-11-24 MED ORDER — SODIUM CHLORIDE 0.9 % IV SOLN: 500.0000 mL | Freq: Once | INTRAVENOUS | Status: DC

## 2024-11-24 NOTE — Progress Notes (Signed)
 VS by EJ  Pt's states no medical or surgical changes since previsit or office visit.

## 2024-11-24 NOTE — Patient Instructions (Addendum)
 Resume previous diet  Continue present medications  Await pathology results  Repeat colonoscopy for surveillance based on pathology results  See handouts on polyps and hemorrhoids  YOU HAD AN ENDOSCOPIC PROCEDURE TODAY AT THE  ENDOSCOPY CENTER:   Refer to the procedure report that was given to you for any specific questions about what was found during the examination.  If the procedure report does not answer your questions, please call your gastroenterologist to clarify.  If you requested that your care partner not be given the details of your procedure findings, then the procedure report has been included in a sealed envelope for you to review at your convenience later.  YOU SHOULD EXPECT: Some feelings of bloating in the abdomen. Passage of more gas than usual.  Walking can help get rid of the air that was put into your GI tract during the procedure and reduce the bloating. If you had a lower endoscopy (such as a colonoscopy or flexible sigmoidoscopy) you may notice spotting of blood in your stool or on the toilet paper. If you underwent a bowel prep for your procedure, you may not have a normal bowel movement for a few days.  Please Note:  You might notice some irritation and congestion in your nose or some drainage.  This is from the oxygen used during your procedure.  There is no need for concern and it should clear up in a day or so.  SYMPTOMS TO REPORT IMMEDIATELY: Following lower endoscopy (colonoscopy or flexible sigmoidoscopy):  Excessive amounts of blood in the stool  Significant tenderness or worsening of abdominal pains  Swelling of the abdomen that is new, acute  Fever of 100F or higher  For urgent or emergent issues, a gastroenterologist can be reached at any hour by calling [phone]. Do not use MyChart messaging for urgent concerns.   DIET:  We do recommend a small meal at first, but then you may proceed to your regular diet.  Drink plenty of fluids but you should  avoid alcoholic beverages for 24 hours.  ACTIVITY:  You should plan to take it easy for the rest of today and you should NOT DRIVE or use heavy machinery until tomorrow (because of the sedation medicines used during the test).    FOLLOW UP: Our staff will call the number listed on your records the next business day following your procedure.  We will call around 7:15- 8:00 am to check on you and address any questions or concerns that you may have regarding the information given to you following your procedure. If we do not reach you, we will leave a message.     If any biopsies were taken you will be contacted by phone or by letter within the next 1-3 weeks.  Please call us  at [phone] if you have not heard about the biopsies in 3 weeks.   SIGNATURES/CONFIDENTIALITY: You and/or your care partner have signed paperwork which will be entered into your electronic medical record.  These signatures attest to the fact that that the information above on your After Visit Summary has been reviewed and is understood.  Full responsibility of the confidentiality of this discharge information lies with you and/or your care-partner.

## 2024-11-24 NOTE — Progress Notes (Signed)
 "   GASTROENTEROLOGY PROCEDURE H&P NOTE   Primary Care Physician: Jesus Elberta Gainer, FNP    Reason for Procedure:  Colon cancer surveillance, colon polyp surveillance  Plan:    Colonoscopy  Patient is appropriate for endoscopic procedure(s) in the ambulatory ([facility]) setting.  The nature of the procedure, as well as the risks, benefits, and alternatives were carefully and thoroughly reviewed with the patient. Ample time for discussion and questions allowed. The patient understood, was satisfied, and agreed to proceed. I personally addressed all patient questions and concerns.     HPI: Tamara Watkins is a 58 y.o. female who presents for colonoscopy for ongoing colon cancer surveillance. Patient is otherwise without complaints or active issues today.  Colonoscopy in 01/2019 performed for diarrhea notable for 2 small right-sided tubular adenomas resected.  A larger 25 mm polyp in the rectosigmoid colon resected.  Pathology demonstrated tubulovillous adenoma with high-grade dysplasia and a small 1 mm focus of adenocarcinoma.  While the adenocarcinoma invaded the superficial submucosa, this was located 3 mm from the cauterized margin, demonstrating clear margins.    This was further evaluated as below:   -Repeat colonoscopy in 09/2019: 2 mm hyperplastic polyp in the sigmoid, tattoo in sigmoid with healthy appearing scar distal to the tattoo.  This was extensively biopsied and negative for adenoma, dysplasia, malignancy -CT chest/abdomen/pelvis (09/2019): 1.2 x 0.9 x 0.9 fluid-filled hepatic cyst, 3 mm hepatic cyst, 4 mm hepatic cyst.  Otherwise normal-appearing liver.  No concerning lesions, e/o metastasis, no lymphadenopathy -MRI pelvis (09/2019): Normal, without lymphadenopathy or residual polyp/lesion -Referral to Dr. Lonni Pizza at [facility] (10/28/2019): No plan for surgical intervention.  Recommend close surveillance with flexible sigmoidoscopy in 3-6 months, repeat MRI  pelvis in 6 months, follow-up in [facility] clinic in 3 months - Flexible sigmoidoscopy (03/2020): well healed scar in tattoo field c/w previous hyperplastic polyp resection. Healthy appearing post polypectomy scar in rectosigmoid.  - MRI Pelvis 03/2020: Normal, no mass or lymphadenopathy  - Colonoscopy (10/2020): 2 subcentimeter tubular adenomas, tattooing rectosigmoid without residual polypoid tissue, internal hemorrhoids. Repeat in 1 year - MRI liver (01/2021): Benign liver cyst measuring 1.3 x 1.0 cm, stable from previous CT.  No further imaging needed. - Colonoscopy (10/2021): Tattoo in sigmoid colon with normal surrounding mucosa.  Otherwise normal colon.  Small internal hemorrhoids.  Normal TI.  Repeat in 3 years.   Past Medical History:  Diagnosis Date   Agatston coronary artery calcium score less than 100 2013   Allergy     seasonal allergies   Anxiety    hx of   Cancer (HCC) 2020   colon CA   Environmental allergies    GERD (gastroesophageal reflux disease)    hx of   Gynecomastia 03/01/2012   Headache(784.0)    History of syncope    with low bp orthostatic onset hs   Hives    chronic   Hx of colonic polyps    5 year recall history of rectal bleeding   Hx of skin cancer, basal cell    Hyperlipidemia    Internal hemorrhoids    Migraine    Urinary incontinence    related to childbirth urge and stress jacklyn helps    Past Surgical History:  Procedure Laterality Date   BIOPSY  10/07/2019   Procedure: BIOPSY;  Surgeon: San Sandor GAILS, DO;  Location: [facility];  Service: Gastroenterology;;   CESAREAN SECTION     x 1   COLONOSCOPY     COLONOSCOPY  WITH PROPOFOL  N/A 10/07/2019   Procedure: COLONOSCOPY WITH PROPOFOL ;  Surgeon: San Sandor GAILS, DO;  Location: [facility];  Service: Gastroenterology;  Laterality: N/A;   DESTRUCTION OF LESION ANAL     DILATION AND CURETTAGE OF UTERUS     for miscarrage   MOHS SURGERY     neck   POLYPECTOMY     REDUCTION MAMMAPLASTY  2013    WISDOM TOOTH EXTRACTION      Prior to Admission medications  Medication Sig Start Date End Date Taking? Authorizing Provider  cholecalciferol (VITAMIN D3) 25 MCG (1000 UNIT) tablet Take 1,000 Units by mouth daily.   Yes [provider]  estradiol (CLIMARA - DOSED IN MG/24 HR) 0.025 mg/24hr patch Place 0.025 mg onto the skin once a week.   Yes [provider]  levocetirizine (XYZAL ) 5 MG tablet Take 5 mg by mouth every evening.   Yes [provider]  progesterone (PROMETRIUM) 100 MG capsule Take 100 mg by mouth daily.   Yes [provider]  desloratadine (CLARINEX) 5 MG tablet Take 5 mg by mouth daily. Patient not taking: Reported on 11/10/2024    [provider]  EPINEPHrine 0.3 mg/0.3 mL IJ SOAJ injection Inject 0.3 mg into the muscle as needed.    [provider]  fexofenadine (ALLEGRA) 60 MG tablet Take 60 mg by mouth daily as needed for allergies or rhinitis.    [provider]  hydrocortisone 2.5 % cream Apply 1 Application topically. As needed    [provider]  ibuprofen (ADVIL) 200 MG tablet Take 400 mg by mouth every 8 (eight) hours as needed for headache. Patient not taking: Reported on 11/10/2024    [provider]  tirzepatide (ZEPBOUND) 2.5 MG/0.5ML injection vial Inject 2.4 mLs into the skin. Every 2 weeks    [provider]  triamcinolone cream (KENALOG) 0.1 % Apply 1 Application topically.    [provider]    Current Outpatient Medications  Medication Sig Dispense Refill   cholecalciferol (VITAMIN D3) 25 MCG (1000 UNIT) tablet Take 1,000 Units by mouth daily.     estradiol (CLIMARA - DOSED IN MG/24 HR) 0.025 mg/24hr patch Place 0.025 mg onto the skin once a week.     levocetirizine (XYZAL ) 5 MG tablet Take 5 mg by mouth every evening.     progesterone (PROMETRIUM) 100 MG capsule Take 100 mg by mouth daily.     desloratadine (CLARINEX) 5 MG tablet Take 5 mg by mouth daily.  (Patient not taking: Reported on 11/10/2024)     EPINEPHrine 0.3 mg/0.3 mL IJ SOAJ injection Inject 0.3 mg into the muscle as needed.     fexofenadine (ALLEGRA) 60 MG tablet Take 60 mg by mouth daily as needed for allergies or rhinitis.     hydrocortisone 2.5 % cream Apply 1 Application topically. As needed     ibuprofen (ADVIL) 200 MG tablet Take 400 mg by mouth every 8 (eight) hours as needed for headache. (Patient not taking: Reported on 11/10/2024)     tirzepatide (ZEPBOUND) 2.5 MG/0.5ML injection vial Inject 2.4 mLs into the skin. Every 2 weeks     triamcinolone cream (KENALOG) 0.1 % Apply 1 Application topically.     Current Facility-Administered Medications  Medication Dose Route Frequency Provider Last Rate Last Admin   0.9 %  sodium chloride  infusion  500 mL Intravenous Once Hula Tasso V, DO        Allergies as of 11/24/2024 - Review Complete 11/24/2024  Allergen  Reaction Noted   Codeine Other (See Comments)    Levofloxacin  Other (See Comments) 02/10/2011    Family History  Problem Relation Age of Onset   Hyperlipidemia Mother    Breast cancer Mother    Lung cancer Mother    Depression Father    Hypertension Father    Other Father        tranverse mylitis   Colon polyps Father    Suicidality Father    Depression Brother    Hypertension Brother    Colon polyps Paternal Grandmother    Depression Daughter    Anxiety disorder Daughter    Allergies Other        Children   Colon cancer Neg Hx        ? great uncles   Rectal cancer Neg Hx    Stomach cancer Neg Hx    Esophageal cancer Neg Hx     Social History   Socioeconomic History   Marital status: Married    Spouse name: Not on file   Number of children: 2   Years of education: Not on file   Highest education level: Not on file  Occupational History   Occupation: Administrator  Tobacco Use   Smoking status: Never   Smokeless tobacco: Never  Vaping Use   Vaping status: Never Used  Substance and  Sexual Activity   Alcohol use: Yes    Alcohol/week: 14.0 standard drinks of alcohol    Types: 14 Glasses of wine per week    Comment: 3 to 4 nights a week (1 to 2 glasses of wine)   Drug use: No   Sexual activity: Not on file  Other Topics Concern   Not on file  Social History Narrative   Occupation: Programme Researcher, 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[employer], Masters level education, [employer] 4 days per week   Married   Regular exercise- yes   HH of 4   No pets   Recently moved from [place]   G4P2   1 glass wine per night   Social Drivers of Health   Tobacco Use: Low Risk (11/24/2024)   Patient History    Smoking Tobacco Use: Never    Smokeless Tobacco Use: Never    Passive Exposure: Not on file  Financial Resource Strain: Not on file  Food Insecurity: Low Risk (04/13/2024)   Received from [facility]   Epic    Within the past 12 months, you worried that your food would run out before you got money to buy more: Never true    Within the past 12 months, the food you bought just didn't last and you didn't have money to get more. : Never true  Transportation Needs: No Transportation Needs (04/13/2024)   Received from [facility]    In the past 12 months, has lack of reliable transportation kept you from medical appointments, meetings, work or from getting things needed for daily living? : No  Physical Activity: Not on file  Stress: Not on file  Social Connections: Unknown (04/11/2022)   Received from [facility]   Social Network    Social Network: Not on file  Intimate Partner Violence: Unknown (03/03/2022)   Received from [facility]   HITS    Physically Hurt: Not on file    Insult or Talk Down To: Not on file    Threaten Physical Harm: Not on file    Scream or Curse: Not on file  Depression (EYV7-0): Not on file  Alcohol Screen: Not  on file  Housing: Low Risk (04/13/2024)   Received from [facility]   Epic    What is your living situation today?: I have a steady place to live    Think  about the place you live. Do you have problems with any of the following? Choose all that apply:: None/None on this list  Utilities: Low Risk (04/13/2024)   Received from [facility]   Utilities    In the past 12 months has the electric, gas, oil, or water company threatened to shut off services in your home? : No  Health Literacy: Not on file    Physical Exam: Vital signs in last 24 hours: @BP  110/64   Pulse 92   Temp (!) 97.2 F (36.2 C)   Ht 5' 6 (1.676 m)   Wt 149 lb 12.8 oz (67.9 kg)   SpO2 98%   BMI 24.18 kg/m  GEN: NAD EYE: Sclerae anicteric ENT: MMM CV: Non-tachycardic Pulm: CTA b/l GI: Soft, NT/ND NEURO:  Alert & Oriented x 3   Sandor Flatter, DO [facility]   11/24/2024 10:27 AM  "

## 2024-11-24 NOTE — Progress Notes (Signed)
 Sedate, gd SR, tolerated procedure well, VSS, report to RN

## 2024-11-24 NOTE — Op Note (Signed)
 [facility] Patient Name: Tamara Watkins Procedure Date: 11/24/2024 10:28 AM MRN: [id number] Endoscopist: Sandor Flatter , MD, [id number] Age: 58 Referring MD:  Date of Birth: 26-Dec-1965 Gender: Female Account #: [id number] Procedure:                Colonoscopy Indications:              Surveillance: Personal history of adenomatous                            polyps on last colonoscopy 3 years ago, High risk                            colon cancer surveillance: Personal history of                            colon cancer                           Colonoscopy in 01/2019 with a 25 mm polyp in the                            rectosigmoid that was resected endoscopically, with                            pathology demonstrating TVA with HGD and a small 1                            mm focus of adenocarcinoma. This was completely                            resected via colonoscopy. Has since had multiple                            MRIs and follow-up flexible sigmoidoscopy and                            colonoscopy demonstrating no evidence of                            recurrence, including most recent colonoscopy in                            10/2021. Presents today for ongoing surveillance. Medicines:                Monitored Anesthesia Care Procedure:                Pre-Anesthesia Assessment:                           - Prior to the procedure, a History and Physical                            was performed, and patient medications and  allergies were reviewed. The patient's tolerance of                            previous anesthesia was also reviewed. The risks                            and benefits of the procedure and the sedation                            options and risks were discussed with the patient.                            All questions were answered, and informed consent                            was obtained. Prior Anticoagulants: The patient  has                            taken no anticoagulant or antiplatelet agents. ASA                            Grade Assessment: II - A patient with mild systemic                            disease. After reviewing the risks and benefits,                            the patient was deemed in satisfactory condition to                            undergo the procedure.                           After obtaining informed consent, the colonoscope                            was passed under direct vision. Throughout the                            procedure, the patient's blood pressure, pulse, and                            oxygen saturations were monitored continuously. The                            Olympus Scope SN [id number] was introduced through the                            anus and advanced to the the cecum, identified by                            appendiceal orifice and ileocecal valve. The  colonoscopy was performed without difficulty. The                            patient tolerated the procedure well. The quality                            of the bowel preparation was good. The ileocecal                            valve, appendiceal orifice, and rectum were                            photographed. Scope In: 10:36:29 AM Scope Out: 10:55:59 AM Scope Withdrawal Time: 0 hours 16 minutes 0 seconds  Total Procedure Duration: 0 hours 19 minutes 30 seconds  Findings:                 The perianal and digital rectal examinations were                            normal.                           A 4 mm polyp was found in the cecum. The polyp was                            flat and mucous-capped. The polyp was removed with                            a cold snare. Resection and retrieval were                            complete. Estimated blood loss was minimal.                           A 3 mm polyp was found in the sigmoid colon. The                            polyp was  sessile and located 1 cm proximal to the                            tattoo site. The polyp was removed with a cold                            snare. Resection and retrieval were complete.                            Estimated blood loss was minimal.                           A 3 mm polyp was found in the rectum. The polyp was                            sessile. The polyp  was removed with a cold snare.                            Resection and retrieval were complete. Estimated                            blood loss was minimal.                           A tattoo was seen in the sigmoid colon, located                            approximatley 18 cm from the anal verge. The tattoo                            site appeared normal.                           Non-bleeding internal hemorrhoids were found during                            retroflexion. The hemorrhoids were small and Grade                            I (internal hemorrhoids that do not prolapse).                           Retroflexion in the right colon was performed. Complications:            No immediate complications. Estimated Blood Loss:     Estimated blood loss was minimal. Impression:               - One 4 mm polyp in the cecum, removed with a cold                            snare. Resected and retrieved.                           - One 3 mm polyp in the sigmoid colon, removed with                            a cold snare. Resected and retrieved.                           - One 3 mm polyp in the rectum, removed with a cold                            snare. Resected and retrieved.                           - A tattoo was seen in the sigmoid colon. The                            tattoo site appeared normal.                           -  Non-bleeding internal hemorrhoids. Recommendation:           - Patient has a contact number available for                            emergencies. The signs and symptoms of potential                             delayed complications were discussed with the                            patient. Return to normal activities tomorrow.                            Written discharge instructions were provided to the                            patient.                           - Resume previous diet.                           - Continue present medications.                           - Await pathology results.                           - Repeat colonoscopy for surveillance based on                            pathology results.                           - Return to GI clinic PRN. Sandor Flatter, MD 11/24/2024 11:06:20 AM

## 2024-11-24 NOTE — Progress Notes (Signed)
 Called to room to assist during endoscopic procedure.  Patient ID and intended procedure confirmed with present staff. Received instructions for my participation in the procedure from the performing physician.

## 2024-11-25 ENCOUNTER — Telehealth: Payer: Self-pay

## 2024-11-25 NOTE — Telephone Encounter (Signed)
 Left message on answering machine.

## 2024-11-26 ENCOUNTER — Ambulatory Visit: Payer: Self-pay | Admitting: Gastroenterology

## 2024-11-26 LAB — SURGICAL PATHOLOGY
